# Patient Record
Sex: Male | Born: 1955 | Race: White | Hispanic: No | Smoking: Never smoker
Health system: Southern US, Community
[De-identification: ages and names within clinical notes are randomized; demographics above are authoritative.]

## PROBLEM LIST (undated history)

## (undated) DIAGNOSIS — E119 Type 2 diabetes mellitus without complications: Secondary | ICD-10-CM

## (undated) DIAGNOSIS — E785 Hyperlipidemia, unspecified: Secondary | ICD-10-CM

## (undated) DIAGNOSIS — R7303 Prediabetes: Secondary | ICD-10-CM

## (undated) DIAGNOSIS — G8929 Other chronic pain: Secondary | ICD-10-CM

## (undated) DIAGNOSIS — I4891 Unspecified atrial fibrillation: Secondary | ICD-10-CM

## (undated) DIAGNOSIS — N200 Calculus of kidney: Secondary | ICD-10-CM

## (undated) DIAGNOSIS — I10 Essential (primary) hypertension: Secondary | ICD-10-CM

## (undated) DIAGNOSIS — K219 Gastro-esophageal reflux disease without esophagitis: Secondary | ICD-10-CM

## (undated) HISTORY — PX: OTHER SURGICAL HISTORY: SHX169

## (undated) HISTORY — PX: NECK SURGERY: SHX720

## (undated) HISTORY — DX: Hyperlipidemia, unspecified: E78.5

## (undated) HISTORY — DX: Type 2 diabetes mellitus without complications: E11.9

## (undated) HISTORY — DX: Essential (primary) hypertension: I10

## (undated) HISTORY — PX: ELBOW SURGERY: SHX618

## (undated) HISTORY — PX: SHOULDER SURGERY: SHX246

---

## 1998-02-22 ENCOUNTER — Emergency Department (HOSPITAL_COMMUNITY): Admission: EM | Admit: 1998-02-22 | Discharge: 1998-02-22 | Payer: Self-pay | Admitting: Emergency Medicine

## 1998-06-01 ENCOUNTER — Encounter: Payer: Self-pay | Admitting: Emergency Medicine

## 1998-06-01 ENCOUNTER — Emergency Department (HOSPITAL_COMMUNITY): Admission: EM | Admit: 1998-06-01 | Discharge: 1998-06-01 | Payer: Self-pay | Admitting: Emergency Medicine

## 1999-11-11 ENCOUNTER — Ambulatory Visit (HOSPITAL_BASED_OUTPATIENT_CLINIC_OR_DEPARTMENT_OTHER): Admission: RE | Admit: 1999-11-11 | Discharge: 1999-11-11 | Payer: Self-pay | Admitting: Orthopedic Surgery

## 2000-04-12 ENCOUNTER — Ambulatory Visit (HOSPITAL_BASED_OUTPATIENT_CLINIC_OR_DEPARTMENT_OTHER): Admission: RE | Admit: 2000-04-12 | Discharge: 2000-04-12 | Payer: Self-pay | Admitting: Orthopedic Surgery

## 2001-03-09 ENCOUNTER — Ambulatory Visit (HOSPITAL_COMMUNITY): Admission: RE | Admit: 2001-03-09 | Discharge: 2001-03-09 | Payer: Self-pay | Admitting: Orthopedic Surgery

## 2002-09-19 ENCOUNTER — Emergency Department (HOSPITAL_COMMUNITY): Admission: EM | Admit: 2002-09-19 | Discharge: 2002-09-19 | Payer: Self-pay | Admitting: Emergency Medicine

## 2002-09-19 ENCOUNTER — Encounter: Payer: Self-pay | Admitting: Emergency Medicine

## 2003-04-04 ENCOUNTER — Emergency Department (HOSPITAL_COMMUNITY): Admission: EM | Admit: 2003-04-04 | Discharge: 2003-04-04 | Payer: Self-pay | Admitting: Internal Medicine

## 2003-08-05 ENCOUNTER — Ambulatory Visit (HOSPITAL_COMMUNITY): Admission: RE | Admit: 2003-08-05 | Discharge: 2003-08-05 | Payer: Self-pay | Admitting: Family Medicine

## 2004-04-05 ENCOUNTER — Inpatient Hospital Stay (HOSPITAL_COMMUNITY): Admission: EM | Admit: 2004-04-05 | Discharge: 2004-04-08 | Payer: Self-pay | Admitting: Emergency Medicine

## 2004-07-14 ENCOUNTER — Emergency Department (HOSPITAL_COMMUNITY): Admission: EM | Admit: 2004-07-14 | Discharge: 2004-07-14 | Payer: Self-pay | Admitting: Emergency Medicine

## 2005-01-08 ENCOUNTER — Encounter: Payer: Self-pay | Admitting: *Deleted

## 2005-01-09 ENCOUNTER — Inpatient Hospital Stay (HOSPITAL_COMMUNITY): Admission: EM | Admit: 2005-01-09 | Discharge: 2005-01-15 | Payer: Self-pay | Admitting: Emergency Medicine

## 2005-02-15 ENCOUNTER — Ambulatory Visit (HOSPITAL_COMMUNITY): Admission: RE | Admit: 2005-02-15 | Discharge: 2005-02-15 | Payer: Self-pay | Admitting: Family Medicine

## 2005-04-16 ENCOUNTER — Emergency Department (HOSPITAL_COMMUNITY): Admission: EM | Admit: 2005-04-16 | Discharge: 2005-04-16 | Payer: Self-pay | Admitting: Emergency Medicine

## 2005-04-27 ENCOUNTER — Emergency Department (HOSPITAL_COMMUNITY): Admission: EM | Admit: 2005-04-27 | Discharge: 2005-04-27 | Payer: Self-pay | Admitting: Emergency Medicine

## 2005-05-09 ENCOUNTER — Ambulatory Visit (HOSPITAL_COMMUNITY): Admission: RE | Admit: 2005-05-09 | Discharge: 2005-05-09 | Payer: Self-pay | Admitting: Family Medicine

## 2005-05-14 ENCOUNTER — Emergency Department (HOSPITAL_COMMUNITY): Admission: EM | Admit: 2005-05-14 | Discharge: 2005-05-14 | Payer: Self-pay | Admitting: *Deleted

## 2005-08-11 ENCOUNTER — Ambulatory Visit: Payer: Self-pay | Admitting: Orthopedic Surgery

## 2005-09-22 ENCOUNTER — Ambulatory Visit: Payer: Self-pay | Admitting: Orthopedic Surgery

## 2007-08-06 ENCOUNTER — Emergency Department (HOSPITAL_COMMUNITY): Admission: EM | Admit: 2007-08-06 | Discharge: 2007-08-07 | Payer: Self-pay | Admitting: Emergency Medicine

## 2009-07-20 ENCOUNTER — Ambulatory Visit (HOSPITAL_COMMUNITY): Admission: RE | Admit: 2009-07-20 | Discharge: 2009-07-20 | Payer: Self-pay | Admitting: Family Medicine

## 2010-10-22 NOTE — Op Note (Signed)
[place]  Patient:    Patrick Herrera, Patrick Herrera                        MRN: [id number] Proc. Date: 04/12/00 Adm. 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  Also, os acromiale.  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  Also, os acromiale.  Hypermobility at os acromiale and acromion interface.  Abrasive tearing superior rotator cuff, no full thickness tears.  OPERATION PERFORMED:  Left shoulder examination under anesthesia, arthroscopy with debridement of rotator cuff.  Arthroscopic acromioplasty with debridement of acromion and os acromiale interface.  Excision of distal clavicle. Bursectomy.  SURGEON:  Loreta Ave, M.D.  ASSISTANT:  Arlys John D. Petrarca, P.A.-C.  ANESTHESIA:  General.  ESTIMATED BLOOD LOSS:  Minimal.  SPECIMENS:  None.  CULTURES:  None.  COMPLICATIONS:  None.  DRESSING:  Soft compressive.  DESCRIPTION OF PROCEDURE:  Patient was brought to the operating room and placed on the operating table in supine position.  After adequate anesthesia had been obtained, left shoulder examined.  Full motion excellent stability. Placed in beach chair position on the shoulder positioner, prepped and draped in the usual sterile fashion.  Three standard arthroscopic portals, anterior, posterior, lateral.  Shoulder entered with blunt obturator, distended and inspected.  From the anterior of the shoulder rotator cuff, biceps, biceps anchor, labrum, articular cartilage, capsular ligamentous structures all were normal.  Cannula redirected subacromially.  Chronic impingement with a down sloping anterior acromion.  Marked hypermobility at the os acromion.  Marked hypermobility at the os acromion  interface with spurring along that interface. Thickened bursa and abrasive changes on the top of the cuff.  Bursa resected as well as the cuff being.  Acromioplasty to a type 1 acromion removing all spurs at the interface as well.  Distal clavicle had grade 4 changes and lateral centimeter sharply resected leaving as much of the capsular attachment in place to maintain some degree of anchoring of the os acromionale.  At completion, I had nice smooth decompression throughout.  The os actually elevates away from the cuff when the arm was abducted.  Thoroughly inspected from all angles.  Instruments and fluid were removed.  Portals, shoulder and bursa injected with Marcaine.  Portals closed with 4-0 nylon.  Sterile compressive dressing applied.  Anesthesia reversed.  Brought to recovery room.  Tolerated surgery well.  No complications. DD:  04/12/00 TD:  04/12/00 Job: [id number]

## 2010-10-22 NOTE — Discharge Summary (Signed)
Patrick Herrera, Patrick Herrera                 ACCOUNT NO.:  [id number]   MEDICAL RECORD NO.:  [id number]          PATIENT TYPE:  INP   LOCATION:  5005                         FACILITY:  [facility]   PHYSICIAN:  Stefani Dama, M.D.  DATE OF BIRTH:  07/20/1955   DATE OF ADMISSION:  01/09/2005  DATE OF DISCHARGE:  01/15/2005                                 DISCHARGE SUMMARY   ADMISSION DIAGNOSES:  1.  C5-C6 fracture.  2.  Status post syncopal episode.  3.  Cocaine, THC abuse.   DISCHARGE/FINAL DIAGNOSES:  1.  C5-C6 fracture.  2.  Status post syncopal episode.  3.  Cocaine, THC abuse.   CONDITION ON DISCHARGE:  Improved.   HOSPITAL COURSE:  [name] is a 55 year old individual who underwent  several angio-diskectomies and decompressions in the past 7 to 8 years ago.  He apparently had a syncopal episode while posing for a picture in his  daughter's wedding party. He also apparently suffered a seizure. Apparently  it was observed that he became really rigid and his eyes deviated to the  right side. The patient does have a history of taking Tylox and Xanax for  many years. On his initial workup, CT of the brain showed normal  intracranial contents, however, a C-spine demonstrated an anterior cervical  plate that had a fracture across it at the C5-C6 level and there was also a  fracture through what appeared to be a previously solid fusion at that  level. The patient was placed in a collar and was admitted to [facility]. He was placed at bedrest and subsequently an MRI of the neck was  performed, which demonstrated the area of the fracture without any evidence  of other disk disruption or other injury. The patient was taken to the  operating room ultimately on January 13, 2005 where he underwent revision  arthrodesis of the C5-C6 level. He tolerated this surgery quite well.  Swallowing has been intact. He has had some modest neck and shoulder pain.  He has been requiring Percocet  for the pain control. The issues of the  patient's substance abuse were addressed during this hospitalization and he  was seen by a Child psychotherapist, who provided the patient with contact  information for [place] and [employer]. The patient  tells me at the time of discharge that he has made arrangements for some  followup in regards to his substance abuse issues. At this time, the patient  is discharged home with strict instructions regarding his use of Percocet.  He is given a prescription for 60 of these without refills. He is also given  a prescription for Valium 5 mg #40 without refills. This should last him  approximately 2 weeks time and after that, he should be weaned away  completely from the medication. The patient will be seen in my office in 3  weeks times for further followup.   CONDITION ON DISCHARGE:  Improving.      Stefani Dama, M.D.  Electronically Signed     HJE/MEDQ  D:  01/14/2005  T:  01/15/2005  Job:  [id number]

## 2010-10-22 NOTE — Op Note (Signed)
[facility]  Patient:    Patrick Herrera, Patrick Herrera Visit Number: [id number] MRN: [id number]          Service Type: DSU Location: DAY Attending Physician:  Cain Sieve Proc. Date: 03/09/01 Admit Date:  03/09/2001                             Operative Report  PREOPERATIVE DIAGNOSES: 1. 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 2. Right shoulder acromioclavicular joint arthrosis. 3. Symptomatic os acromiale.  POSTOPERATIVE DIAGNOSES: 1. Right shoulder severe rotator cuff tendinosis with essentially    full-thickness rotator cuff tear with only a few bursal fibers remaining    intact. 2. Acromioclavicular joint hypertrophic arthropathy. 3. Symptomatic os acromiale. 4. 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  PROCEDURES: 1. Right shoulder glenohumeral joint diagnostic arthroscopy. 2. Chondroplasty of the glenoid. 3. Debridement of essentially a full-thickness rotator cuff tear creating a    full-thickness defect. 4. Subacromial decompression and bursectomy. 5. Distal clavicle resection. 6. Arthroscopic rotator cuff repair.  SURGEON:  Vania Rea. Supple, M.D.  ANESTHESIA:  General endotracheal.  ESTIMATED BLOOD LOSS:  Minimal.  HISTORY:  Patrick Herrera is a 55 year old gentleman, who has had ongoing difficulties with with right shoulder pain, weakness, limitations in motion with clinical examination showing a markedly positive impingement sign as well as weakness to manual testing.  Preoperative MRI scan confirms advanced tendinosis of the rotator cuff with possible full-thickness tear. Hypertrophic arthropathy of the [employer] joint is also noted.  Due to his ongoing pain and functional limitations, he is brought to the operating room at this time for the above-outlined procedures.  Preoperatively, he was counseled on treatment options as well as risks versus benefits thereof.  Possible complications of bleeding, infection, neurovascular  injury, persistence of pain, loss of motion, recurrence of rotator cuff tear, and possible need for additional surgery were reviewed.  He understands and accepts and agrees with our planned procedure.  DESCRIPTION OF PROCEDURE:  After undergoing routine preoperative evaluation, the patient was placed supine on the operating table and underwent smooth induction of general endotracheal anesthesia.  He received 1 g of IV cephalosporin prophylactically.  He was turned to the left lateral decubitus position on the beanbag and appropriately padded and protected with the right arm suspended at the 70/30 position with 15 pounds of traction.  The right shoulder girdle region was sterilely prepped and draped in the standard fashion.  Examination under anesthesia showed no evidence for glenohumeral instability.  A standard posterior portal was established in the glenohumeral joint, and an anterior portal was established under direct visualization.  The glenoid showed a broad area of chondromalacia approximately 2 x 2 cm at the anterior margin adjacent to the labrum.  This was debrided with a shaver.  The labrum was probed and was not felt to be detached or destabilized.  There was evidence for a broad area of marked thinning of the rotator cuff just posterior to the biceps tendon, measuring approximately 2-3 cm in width.  This area was tagged with a PDS for later evaluation on the bursal surface.  Biceps was well-attached with no obvious fraying.  The remaining inspection of the glenohumeral joint was unremarkable.  At this point, the arm was then dropped down to 30 degrees of abduction with the arthroscope introduced into the subacromial space through the posterior portal and direct lateral portals, establishing a subacromial space.  Abundant, proliferative bursal tissue was excised with  a combination of the shaver and the Mitek vapor.  The vapor was then used to remove the periosteum from the  undersurface of the anterior path of the acromion.  Subacromial decompression was then performed with the bur, creating a type 1 morphology.  The bur was then used to perform a subacromial decompression, creating a type 1 morphology.  The CA ligament was divided and resected distally.  We then established a portal directly anterior to the distal clavicle, and a distal clavicle resection was performed.  Care was taken to make sure that the entire circumference of the distal clavicle could be visualized to ensure adequate removal of bone.  We then completed the subacromial bursectomy and carefully inspected the bursal surface of the rotator cuff in the area where the tag suture had been placed and did indeed show that the rotator cuff had essentially a full-thickness defect with only very scant number of fibers remaining intact.  These were debrided away, and a full-thickness defect was then created approximately 3 cm in width.  The adjacent portion of the greater tuberosity was then freed of soft tissues using the Arthrex wand.  A bur was then introduced and used to create a bony cancellous bed at the greater tuberosity adjacent to the rotator cuff tear. Through a stab wound and off the lateral margin of the acromion, we placed two Arthrex Bio-Anchor corkscrew suture anchors.  The free limbs of the suture anchors were then passed through the free margin of the rotator cuff, utilizing the Mitek suture retriever.  A total of 8 suture limbs were passed, allowing the formation of four horizontal mattress sutures.  These were then sequentially tied, utilizing extender and sliding arthroscopic knot with multiple overhand throws and alternating posts.  This allowed excellent reduction of the free margin of the rotator cuff down to the exposed cancellous bony bed.  Inspection and probing at the end showed good stability. At this point, we then completed the subacromial bursectomy.  Fluid  and instruments were removed.  The portals were closed with staples.  Marcaine 0.5% 20 cc with epinephrine was instilled about the portals and then in the  subacromial space. Xeroform and a bulky dry dressing was then wrapped up the right shoulder.  The patient was then rolled supine, extubated, and taken to the recovery room in stable condition with a shoulder immobilizer of the right upper extremity. Attending Physician:  Cain Sieve DD:  03/09/01 TD:  03/09/01 Job: [id number]

## 2010-10-22 NOTE — H&P (Signed)
NAMEJACKOB, Patrick Herrera                 ACCOUNT NO.:  [id number]   MEDICAL RECORD NO.:  [id number]          PATIENT TYPE:  INP   LOCATION:  A304                          FACILITY:  [facility]   PHYSICIAN:  Ky Barban, M.D.DATE OF BIRTH:  20-Nov-1955   DATE OF ADMISSION:  04/05/2004  DATE OF DISCHARGE:  LH                                HISTORY & PHYSICAL   CHIEF COMPLAINT:  Recurrent left flank pain.   HISTORY:  A 55 year old gentleman presented to the emergency room last night  with severe pain in his left flank. No nausea, vomiting, fever, or chills.  CT scan shows 8-mm stone in the left ureterovesical junction causing partial  obstruction. He was having considerable pain, so it was decided to admit him  for control of pain and further management.   PAST MEDICAL HISTORY:  He has history of having problem with his spine and  his neck and had several surgeries on his neck and also left ankle, and  eventually has become disabled, cannot work, but denies any other severe  medical problem. He does have history of having kidney stones which he has  been passing. There was 1 stone several years ago, underwent stone basket.  It was in 1989.   FAMILY HISTORY:  No history of prostate cancer.   SOCIAL HISTORY:  Does not smoke or drink.   REVIEW OF SYSTEMS:  Unremarkable.   PHYSICAL EXAMINATION:  VITAL SIGNS:  Blood pressure is 130/80, temperature  is normal.  CENTRAL NERVOUS SYSTEM:  Negative.  HEENT:  Negative.  CHEST:  Symmetrical.  HEART:  Regular sinus rhythm. No murmur.  ABDOMEN:  Soft, flank. Liver, spleen, and kidneys are not palpable. 1+ left  CVA tenderness.  EXTERNAL GENITALIA:  Circumcised meatus and testicles are normal.  RECTAL:  Deferred.  EXTREMITIES:  Normal.   IMPRESSION:  Left ureteral calculus.   PLAN:  IV fluids, parenteral analgesia.    Moha  MIJ/MEDQ  D:  04/06/2004  T:  04/06/2004  Job:  [id number]

## 2010-10-22 NOTE — Op Note (Signed)
[place]  Patient:    Patrick Herrera, Patrick Herrera                        MRN: [id number] Proc. Date: 11/11/99 Adm. Date:  [id number] Disc. 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  PROCEDURE:  Left elbow exam under anesthesia, arthroscopy with partial synovectomy and chondroplasty of radial head, open exploration of lateral epicondylar region with debridement of mucinous degeneration from ECRB tendon, and drilling of epicondyle with primary repair of superficial extensors.  SURGEON:  Loreta Ave, M.D.  ASSISTANT:  Arlys John D. Petrarca, P.A.-C.  ANESTHESIA:  General.  ESTIMATED BLOOD LOSS:  Minimal.  TOURNIQUET TIME:  Forty-five minutes.  SPECIMENS:  None.  CULTURES:  None.  COMPLICATIONS:  None.  DRESSING:  Self-compressive.  DESCRIPTION OF PROCEDURE:  The patient was brought to the operating room and after adequate anesthesia had been obtained, the tourniquet was applied about the upper aspect of the left arm.  Prepped and draped in the usual sterile fashion.  An incision made over the lateral epicondyle, extending down along the extensor mass.  The skin and subcutaneous tissue divided.  Superficial extensors intact.  Open longitudinally, exposing the deep extensor ECRB tendon.  Marked mucinous degeneration over the proximal centimeter which was sharply resected back to healthy tissue.  The epicondyle debrided and treated with multiple drilling.  Lateral incision was used through the lateral portal and a medial portal was created just anterior and distal to the  medial epicondyle, avoiding the ulnar nerve.  The elbow entered, distended, and inspected.  Anterior synovitis debrided with the shaver.  Uneven frayed articular cartilage and radial head margin debrided to a stable surface with the shaver.  All structures thoroughly assessed with no other significant degenerative changes or findings.  Instruments and fluid removed.  The wounds were irrigated.  Portal closed with nylon.  The lateral incision was then closed with interrupted Vicryl, bringing the superficial extensors and then incorporating the deep extensor into the recreating longitudinal continuity of the extensor mass to the epicondyle, bringing them over to bleeding ______ and site of multiple drilling.  The skin and subcutaneous tissue closed with Vicryl, subcutaneous and subcuticular with Steri-Strips applied.  The margins of both wounds injected with Marcaine as was the elbow.  A sterile compressive dressing and splint applied.  Tourniquet deflated and removed.  The anesthesia reversed and brought to the recovery room.  Tolerated surgery well with no complications. DD:  11/11/99 TD:  11/16/99 Job: [id number]

## 2010-10-22 NOTE — H&P (Signed)
Patrick Herrera, Patrick Herrera NO.:  [id number]   MEDICAL RECORD NO.:  [id number]          PATIENT TYPE:  INP   LOCATION:  5005                         FACILITY:  [facility]   PHYSICIAN:  Hilda Lias, M.D.   DATE OF BIRTH:  1955/10/08   DATE OF ADMISSION:  01/09/2005  DATE OF DISCHARGE:                                HISTORY & PHYSICAL   Patrick Herrera is a gentleman who six years ago underwent three anterior  cervical diskectomy with fusion with a bone graft and a plate.  Last night  he suddenly developed seizure and fell down, hitting his head.  Interestingly enough, he was in a party where the daughter was taking  pictures, and she was able to show me a picture where you can see his has  really rigid, and his eyes deviated to the right side. Soon after, the  patient fell down.  The history is patient had been taking Tylox and Senna  for many years About five to six days ago he decided to stop by himself.  He  was seen at [facility].  We were called, and he was put in a brace.  At our request, he came today to be seen by [place] in the emergency room.  He is  having quite a bit of neck pain, some tenderness and tingling sensation  going to the left lower extremity.   PAST MEDICAL HISTORY:  1.  Three anterior cervical fusions.  2.  Surgery of the foot.  3.  Surgery of the right shoulder.   ALLERGIES:  PENICILLIN.   FAMILY HISTORY:  Unremarkable.   SOCIAL HISTORY:  He drinks once in a while.  He does not smoke.   PHYSICAL EXAMINATION:  HEENT:  There is abrasion of the right side of the  face.  He is in an __________brace.  He had quite a bit of tenderness of  cervical muscle.  LUNGS: Clear.  HEART:  Sounds normal.  ABDOMEN:  Normal.  EXTREMITIES:  Normal pulses.  NEUROLOGIC:  The patient oriented x 3.  Strength is normal.  Sensation is  normal, but he complains of tingling sensation which involves mostly the  thumb and index finger left  Hand.  Reflexes  symmetrical, coordination normal.  The cervical spine films  shows there is a plate from C4 down to C6 with bone graft, and there is a  crack in the plate itself with the fracture going through the bone graft on  the body of C5-6.   The CT scan showed the same finding with no evidence of bone in the canal.   CLINICAL IMPRESSION:  1.  Fracture of C5-C6 bone graft, plate, with status post fusion of 4-5 and      5-6.  2.  Seizure with longstanding ingestion of Xanax and Tylox.   RECOMMENDATIONS:  I talked to both of them at length.  The plan here is that  I was going to send him home with some medications and then to follow him  with x-ray in the office.  Nevertheless, the main problem here  is he lives  by himself.  He already has had a seizure, and I am really concerned that  patient would be driving and develop more seizure which might jeopardize  himself and the people around him.  I am going to admit him to the 3000  unit.  He is going to be admitted into the cervical surgeon who performed  the surgery.  I will be talking to him probably tomorrow.  We are going to  get the [facility] to help [place] with the diagnosis.  They are fully  aware that if there is no healing of the factor or if he does not improve  from the point of view of the finger sensation, then we are going to do a  myelogram with a CT scan.  Right now the flexion and extension shows no  evidence of any subluxation.       EB/MEDQ  D:  01/09/2005  T:  01/09/2005  Job:  [id number]

## 2010-10-22 NOTE — Op Note (Signed)
NAMEJOSHUAL, Herrera                 ACCOUNT NO.:  [id number]   MEDICAL RECORD NO.:  [id number]          PATIENT TYPE:  INP   LOCATION:  5005                         FACILITY:  [facility]   PHYSICIAN:  Stefani Dama, M.D.  DATE OF BIRTH:  January 21, 1956   DATE OF PROCEDURE:  01/13/2005  DATE OF DISCHARGE:                                 OPERATIVE REPORT   PREOPERATIVE DIAGNOSIS:  Fracture, C5-C6.   POSTOPERATIVE DIAGNOSIS:  Fracture, C5-C6.   PROCEDURE:  Anterior arthrodesis, C5-C6, with structural allograft and  Alphatec the plate fixation.   SURGEON:  Stefani Dama, M.D.   ANESTHESIA:  General endotracheal.   INDICATIONS:  [name] is a 55 year old individual who has had a  previous anterior decompression and arthrodesis with [employer] hardware and  autograft.  He had been doing well until he suffered a syncopal episode and  fell forward, striking his head and his face.  Initial x-ray demonstrated  that he had fracture of the plate, fracture across C5-C6.  The patient was  stabilized in a collar.  He was neurologically intact.  An MRI demonstrated  the evidence for the acute fracture and no other significant changes.  He  was taken to the operating room to undergo surgical stabilization of C5-C6.   PROCEDURE:  The patient was brought to the operating room supine on the  stretcher.  After the smooth induction of general endotracheal anesthesia,  was placed in five pounds of halter traction.  The neck was prepped with  DuraPrep and draped in a sterile fashion.  A transverse incision was carried  out through the previously-made incision and the dissection was then carried  down through the platysma.  The plane between the sternocleidomastoid and  the strap muscles was dissected until the prevertebral space was reached and  the lateral aspect of the plate was identified.  The soft tissues that were  scarred down in this area were carefully dissected, ultimately using a bit  of  monopolar cautery to open down to the plate.  Screws were identified  sequentially along the borders of the plate as the dissection continued and  the screws were then removed.  The plate itself was noted to have a clean  break without any evidence of significant wear at the fractured ends of the  plate.  It did appear that this break was indeed recent.  The plate was then removed after removing the sequential screws and once the  esophagus was well dissected off the border of the plate, a permanent self-  retaining Caspar retractor was placed into the wound.  With portions of the  plate removed, a small amount of dissection of overgrown bone from the  inferior margin of the plate was undertaken using a [employer]  osteotome, and this allowed for clearance of the disk space itself at the  level of the arthrodesis.  There was noted to be a crevice in the bone and  by working through this crevice, some portions of scar tissue in lateral  aspects of the crevice were identified and centrally bone fracture  fragments  itself was identified.  I suspect that the bone had been healed minimally in  this area as there was a fairly well-defined crevice of warm the inferior  border which was filled with thick, dense scar tissue.  This was drilled  down smooth so as to identify the end plates well, and the superior end  plate of body of C6 was then drilled into so as to achieve definitive  contact with the marrow cavity.  The inferior end plate of C5 was similarly  squared off in drilled into again to identify the marrow cavity.  With these  plates being secured, the endplates were cleaned smoothly with a 4 mm barrel  bit.  A 7 mm trans-graft was prepared by drilling out the center, flattening  the edges, and tamping this graft into the interspace.  This was done after  it was filled with Vitoss putty that had been prepared by mixing it with  some of the blood that had been evacuated from the  drilling into the  cancellous bone of the end plates.  This putty was packed into the center of  the trans-graft.  The trans-graft was then tamped and countersunk and then the area  surrounding trans-graft itself was packed with the Vitoss bone putty.  Traction was then removed.  A 20 mm standard-size Alphatec plate was then  affixed to the ventral aspect of the vertebral body using the previously-  drilled screws, two of which were slightly off center.  These were redrilled  and locking 4 x 14 mm screws were placed into the plate.  These were then  secured and final localizing radiograph identified good position of the  surgical construct.  The wound was then copiously irrigated with antibiotic  irrigating solution.  Hemostasis was checked in the soft tissues.  Once this  was verified, 3-0 Vicryl was used to close the platysma and 3-0 Vicryl in  the subcuticular tissues.  Dermabond was placed on the skin.  The patient  tolerated procedure well, was returned to recovery room in stable condition.      Stefani Dama, M.D.  Electronically Signed     HJE/MEDQ  D:  01/13/2005  T:  01/14/2005  Job:  [id number]

## 2010-10-22 NOTE — Op Note (Signed)
Patrick Herrera, Patrick Herrera                 ACCOUNT NO.:  [id number]   MEDICAL RECORD NO.:  [id number]          PATIENT TYPE:  INP   LOCATION:  A304                          FACILITY:  [facility]   PHYSICIAN:  Ky Barban, M.D.DATE OF BIRTH:  04/16/1956   DATE OF PROCEDURE:  DATE OF DISCHARGE:                                 OPERATIVE REPORT   PREOPERATIVE DIAGNOSIS:  Left ureteral calculus.   POSTOPERATIVE DIAGNOSIS:  Left ureteral calculus.   PROCEDURES:  Cystoscopy, left retrograde pyelogram, ureteroscopic stone  basket, holmium laser lithotripsy, insertion of double J stent with string  size 5 [place], 24 cm.   ANESTHESIA:  General.   PROCEDURE:  The patient is given general endotracheal anesthesia, placed in  lithotomy position after the usual prep and drape.  A #25 cystoscope was  introduced into the bladder.  It is inspected.  The left ureteral orifice  and the intramural ureter appear to be quite edematous and distended, but no  stone is visible through the orifice.  At this point a wedge catheter is  passed and Hypaque is injected in the left ureter.  I can see there is a  filling defect in the left ureterovesical junction, and the ureter above  that is markedly dilated.  At this point the wedge catheter is removed and a  guidewire is passed up into the renal pelvis.  The cystoscope is removed,  leaving the guidewire in place.  Now alongside the guidewire I introduced a  short rigid ureteroscope.  Before introducing the ureteroscope, I had  dilated the intramural ureter with a balloon dilator.  Now the short rigid  ureteroscope is passed alongside the ureteroscope.  I went to the level of  the stone and under direct vision, the stone is treated with holmium laser.  It is pulverized completely.  Then using a stone basket, these pieces were  carefully basketed.  At the end, the ureter is inspected and it looks  intact, but as I mentioned, there is considerable redness, edema,and  exfoliation of the mucosa at the ureterovesical junction, where that is  where the stone was impacted.  The rest of the distal ureter looks fine.  At  this point the ureteroscope is removed and the cystoscope was reintroduced  over the guidewire and under direct vision with fluoroscopic control, a  double J stent size 5 [place], 24 cm, is passed up into the renal pelvis.  A  nice loop is obtained in the renal pelvis and the bladder.  The cystoscope  is carefully removed, and the string was taped to his penis.  All the  instruments were removed.  The patient left the operating room in  satisfactory condition.     Moha   MIJ/MEDQ  D:  04/07/2004  T:  04/07/2004  Job:  [id number]

## 2010-10-22 NOTE — Discharge Summary (Signed)
NAMECOLIN, ELLERS                 ACCOUNT NO.:  [id number]   MEDICAL RECORD NO.:  [id number]          PATIENT TYPE:  INP   LOCATION:  A304                          FACILITY:  [facility]   PHYSICIAN:  Ky Barban, M.D.DATE OF BIRTH:  08-May-1956   DATE OF ADMISSION:  04/05/2004  DATE OF DISCHARGE:  11/03/2005LH                                 DISCHARGE SUMMARY   This 55 year old gentleman was admitted through the emergency room with left  renal colic. CT showed he had 8-mm stone in the left ureterovesical junction  causing obstruction. The patient was admitted.   HOSPITAL COURSE:  Routine admission workup, CBC, urinalysis, met7 is normal.  He was started on IV fluid and parenteral analgesia. Currently, due to  complaint of pain, requested that he wanted me to go ahead and remove the  stone. After discussing various treatment options, I elected to go ahead and  do a stone basket. He was taken to the operating room on November 2.  Cystoscopy, left retrograde pyelogram, ureteroscopic stone basket, Holmium  laser lithotripsy was done, and a double J stent was left in with string  attached. Postoperatively, he did fine, but during the night, apparently the  stent pulled out, and he started to have incontinence, so I removed the  stent and was sent home and has no problems.   FINAL DIAGNOSIS:  Left ureteral calculus.   DISCHARGE CONDITION:  Improved.   DISCHARGE MEDICATIONS:  Percocet 1 q.6h. p.r.n. #30.   FOLLOW UP:  Report to the office in 2 weeks.      MIJ/MEDQ  D:  05/27/2004  T:  05/28/2004  Job:  [id number]

## 2011-02-28 LAB — POCT I-STAT, CHEM 8
BUN: 19
Calcium, Ion: 1 — ABNORMAL LOW
Chloride: 104
Creatinine, Ser: 1.1
Glucose, Bld: 113 — ABNORMAL HIGH
HCT: 51
Hemoglobin: 17.3 — ABNORMAL HIGH
Potassium: 3.4 — ABNORMAL LOW
Sodium: 138
TCO2: 23

## 2011-02-28 LAB — POCT CARDIAC MARKERS
CKMB, poc: 1.4
CKMB, poc: 1.6
Myoglobin, poc: 61.7
Myoglobin, poc: 87.1
Operator id: 284061
Operator id: 284061
Troponin i, poc: <0.05
Troponin i, poc: <0.05

## 2012-05-24 ENCOUNTER — Emergency Department (HOSPITAL_COMMUNITY): Payer: Medicare Other

## 2012-05-24 ENCOUNTER — Encounter (HOSPITAL_COMMUNITY): Payer: Self-pay | Admitting: *Deleted

## 2012-05-24 ENCOUNTER — Emergency Department (HOSPITAL_COMMUNITY)
Admission: EM | Admit: 2012-05-24 | Discharge: 2012-05-24 | Disposition: A | Discharge status: Home / Self Care | Payer: Medicare Other | Attending: Emergency Medicine | Admitting: Emergency Medicine

## 2012-05-24 DIAGNOSIS — R109 Unspecified abdominal pain: Secondary | ICD-10-CM | POA: Insufficient documentation

## 2012-05-24 DIAGNOSIS — R3989 Other symptoms and signs involving the genitourinary system: Secondary | ICD-10-CM | POA: Insufficient documentation

## 2012-05-24 DIAGNOSIS — G8929 Other chronic pain: Secondary | ICD-10-CM | POA: Insufficient documentation

## 2012-05-24 DIAGNOSIS — M549 Dorsalgia, unspecified: Secondary | ICD-10-CM | POA: Insufficient documentation

## 2012-05-24 DIAGNOSIS — Z87891 Personal history of nicotine dependence: Secondary | ICD-10-CM | POA: Insufficient documentation

## 2012-05-24 DIAGNOSIS — N390 Urinary tract infection, site not specified: Secondary | ICD-10-CM | POA: Insufficient documentation

## 2012-05-24 DIAGNOSIS — Z87442 Personal history of urinary calculi: Secondary | ICD-10-CM | POA: Insufficient documentation

## 2012-05-24 DIAGNOSIS — Z79899 Other long term (current) drug therapy: Secondary | ICD-10-CM | POA: Insufficient documentation

## 2012-05-24 DIAGNOSIS — R300 Dysuria: Secondary | ICD-10-CM | POA: Insufficient documentation

## 2012-05-24 DIAGNOSIS — Z880 Allergy status to penicillin: Secondary | ICD-10-CM | POA: Insufficient documentation

## 2012-05-24 DIAGNOSIS — Z9889 Other specified postprocedural states: Secondary | ICD-10-CM | POA: Insufficient documentation

## 2012-05-24 HISTORY — DX: Other chronic pain: G89.29

## 2012-05-24 HISTORY — DX: Calculus of kidney: N20.0

## 2012-05-24 LAB — CBC WITH DIFFERENTIAL/PLATELET
Eosinophils Absolute: 0.2 10*3/uL (ref 0.0–0.7)
Eosinophils Relative: 3 % (ref 0–5)
HCT: 52.1 % — ABNORMAL HIGH (ref 39.0–52.0)
Hemoglobin: 18.6 g/dL — ABNORMAL HIGH (ref 13.0–17.0)
Lymphs Abs: 2.5 10*3/uL (ref 0.7–4.0)
MCH: 30.5 pg (ref 26.0–34.0)
MCV: 85.6 fL (ref 78.0–100.0)
Monocytes Absolute: 0.5 10*3/uL (ref 0.1–1.0)
Monocytes Relative: 6 % (ref 3–12)
RBC: 6.09 MIL/uL — ABNORMAL HIGH (ref 4.22–5.81)

## 2012-05-24 LAB — URINALYSIS, ROUTINE W REFLEX MICROSCOPIC
Bilirubin Urine: NEGATIVE
Hgb urine dipstick: NEGATIVE
Specific Gravity, Urine: >1.03 — ABNORMAL HIGH (ref 1.005–1.030)
Urobilinogen, UA: 0.2 mg/dL (ref 0.0–1.0)

## 2012-05-24 LAB — URINE MICROSCOPIC-ADD ON

## 2012-05-24 LAB — BASIC METABOLIC PANEL
BUN: 16 mg/dL (ref 6–23)
Creatinine, Ser: 1.14 mg/dL (ref 0.50–1.35)
GFR calc non Af Amer: 71 mL/min — ABNORMAL LOW (ref >90)
Glucose, Bld: 143 mg/dL — ABNORMAL HIGH (ref 70–99)
Potassium: 4.2 mEq/L (ref 3.5–5.1)

## 2012-05-24 MED ORDER — ONDANSETRON HCL 4 MG/2ML IJ SOLN
4.0000 mg | Freq: Once | INTRAMUSCULAR | Status: AC
  Administered 2012-05-24: 4 mg via INTRAVENOUS
  Filled 2012-05-24: qty 2

## 2012-05-24 MED ORDER — OXYCODONE-ACETAMINOPHEN 5-325 MG PO TABS: 1.0000 | ORAL_TABLET | ORAL | Status: AC | PRN

## 2012-05-24 MED ORDER — PROMETHAZINE HCL 25 MG/ML IJ SOLN
12.5000 mg | Freq: Once | INTRAMUSCULAR | Status: AC
  Administered 2012-05-24: 12.5 mg via INTRAVENOUS
  Filled 2012-05-24: qty 1

## 2012-05-24 MED ORDER — CIPROFLOXACIN HCL 500 MG PO TABS: 500.0000 mg | ORAL_TABLET | Freq: Two times a day (BID) | ORAL | Status: DC

## 2012-05-24 MED ORDER — HYDROMORPHONE HCL PF 1 MG/ML IJ SOLN
1.0000 mg | Freq: Once | INTRAMUSCULAR | Status: AC
  Administered 2012-05-24: 1 mg via INTRAVENOUS
  Filled 2012-05-24: qty 1

## 2012-05-24 MED ORDER — ONDANSETRON HCL 8 MG PO TABS: 8.0000 mg | ORAL_TABLET | Freq: Three times a day (TID) | ORAL | Status: DC | PRN

## 2012-05-24 NOTE — ED Provider Notes (Signed)
History     CSN: [id number]  Arrival date & time 05/24/12  1405   First MD Initiated Contact with Patient 05/24/12 1450      Chief Complaint  Patient presents with  . Abdominal Pain    (Consider location/radiation/quality/duration/timing/severity/associated sxs/prior treatment) HPI Comments: Patient complains of a gradual onset of left flank pain that began one to 2 weeks ago. States pain at that time was waxing and waning. He states that yesterday the pain began to radiate to his lower abdomen and has been worse since last evening. He complains of difficulty urinating this morning. Patient reports a history of kidney stones in the past but states this pain is similar to previous kidney stones. He denies nausea or vomiting, fever, testicular pain, or discharge from his penis.  Patient is a 56 y.o. male presenting with abdominal pain. The history is provided by the patient.  Abdominal Pain The primary symptoms of the illness include abdominal pain and dysuria. The primary symptoms of the illness do not include fever, shortness of breath, nausea, vomiting, diarrhea or hematemesis. The current episode started more than 2 days ago. The onset of the illness was gradual. The problem has been gradually worsening.  The abdominal pain began yesterday. The pain came on gradually. The abdominal pain has been unchanged since its onset. The abdominal pain is located in the suprapubic region. The abdominal pain radiates to the left flank. The abdominal pain is relieved by nothing. Exacerbated by: Nothing.  The dysuria began today. The discomfort is mild. The dysuria is not associated with discharge, hematuria, frequency, urgency, penile pain or scrotal pain.  Associated with: History of kidney stones. The patient has not had a change in bowel habit. Additional symptoms associated with the illness include back pain. Symptoms associated with the illness do not include chills, diaphoresis, heartburn,  constipation, urgency, hematuria or frequency. Significant associated medical issues do not include diabetes or cardiac disease.    Past Medical History  Diagnosis Date  . Kidney stones   . Chronic pain     Past Surgical History  Procedure Date  . Neck surgery   . Elbow surgery   . Bil knee surgery   . Shoulder surgery     History reviewed. No pertinent family history.  History  Substance Use Topics  . Smoking status: Former Smoker    Types: Cigarettes  . Smokeless tobacco: Not on file  . Alcohol Use: Yes      Review of Systems  Constitutional: Negative for fever, chills, diaphoresis, activity change and appetite change.  HENT: Negative for neck pain.   Respiratory: Negative for chest tightness and shortness of breath.   Cardiovascular: Negative for chest pain and palpitations.  Gastrointestinal: Positive for abdominal pain. Negative for heartburn, nausea, vomiting, diarrhea, constipation, abdominal distention and hematemesis.  Genitourinary: Positive for dysuria, flank pain and difficulty urinating. Negative for urgency, frequency, hematuria, discharge, scrotal swelling, penile pain and testicular pain.  Musculoskeletal: Positive for back pain.  Skin: Negative for color change.  Neurological: Negative for headaches.  All other systems reviewed and are negative.    Allergies  Penicillins  Home Medications   Current Outpatient Rx  Name  Route  Sig  Dispense  Refill  . ALPRAZOLAM 1 MG PO TABS   Oral   Take 1 mg by mouth 4 (four) times daily as needed. anxiety         . IBUPROFEN 200 MG PO TABS   Oral   Take 400 mg  by mouth every 6 (six) hours as needed. pain         . OXYCODONE-ACETAMINOPHEN 5-500 MG PO CAPS   Oral   Take 1 capsule by mouth every 6 (six) hours as needed. pain           BP 176/108  Pulse 118  Temp 98 F (36.7 C) (Oral)  Resp 20  Ht 5\' 8"  (1.727 m)  Wt 222 lb (100.699 kg)  BMI 33.76 kg/m2  SpO2 99%  Physical Exam  Nursing  note and vitals reviewed. Constitutional: He is oriented to person, place, and time. He appears well-developed and well-nourished. No distress.  HENT:  Head: Normocephalic and atraumatic.  Neck: Normal range of motion. Neck supple.  Cardiovascular: Normal rate, regular rhythm, normal heart sounds and intact distal pulses.   No murmur heard. Pulmonary/Chest: Effort normal and breath sounds normal. No respiratory distress. He has no wheezes. He exhibits no tenderness.  Abdominal: Soft. He exhibits no distension and no mass. There is no hepatosplenomegaly. There is tenderness in the suprapubic area. There is no rigidity, no rebound, no guarding, no CVA tenderness and no tenderness at McBurney's point.    Musculoskeletal: Normal range of motion. He exhibits no edema.  Lymphadenopathy:    He has no cervical adenopathy.  Neurological: He is alert and oriented to person, place, and time. He exhibits normal muscle tone. Coordination normal.  Skin: Skin is warm and dry.    ED Course  Procedures (including critical care time)    Results for orders placed during the hospital encounter of 05/24/12  CBC WITH DIFFERENTIAL      Component Value Range   WBC 9.3  4.0 - 10.5 K/uL   RBC 6.09 (*) 4.22 - 5.81 MIL/uL   Hemoglobin 18.6 (*) 13.0 - 17.0 g/dL   HCT 16.1 (*) 09.6 - 04.5 %   MCV 85.6  78.0 - 100.0 fL   MCH 30.5  26.0 - 34.0 pg   MCHC 35.7  30.0 - 36.0 g/dL   RDW 40.9  81.1 - 91.4 %   Platelets 219  150 - 400 K/uL   Neutrophils Relative 64  43 - 77 %   Neutro Abs 6.0  1.7 - 7.7 K/uL   Lymphocytes Relative 27  12 - 46 %   Lymphs Abs 2.5  0.7 - 4.0 K/uL   Monocytes Relative 6  3 - 12 %   Monocytes Absolute 0.5  0.1 - 1.0 K/uL   Eosinophils Relative 3  0 - 5 %   Eosinophils Absolute 0.2  0.0 - 0.7 K/uL   Basophils Relative 0  0 - 1 %   Basophils Absolute 0.0  0.0 - 0.1 K/uL  BASIC METABOLIC PANEL      Component Value Range   Sodium 137  135 - 145 mEq/L   Potassium 4.2  3.5 - 5.1 mEq/L     Chloride 100  96 - 112 mEq/L   CO2 25  19 - 32 mEq/L   Glucose, Bld 143 (*) 70 - 99 mg/dL   BUN 16  6 - 23 mg/dL   Creatinine, Ser 7.82  0.50 - 1.35 mg/dL   Calcium 95.6  8.4 - 21.3 mg/dL   GFR calc non Af Amer 71 (*) >90 mL/min   GFR calc Af Amer 82 (*) >90 mL/min  URINALYSIS, ROUTINE W REFLEX MICROSCOPIC      Component Value Range   Color, Urine YELLOW  YELLOW   APPearance CLEAR  CLEAR  Specific Gravity, Urine >1.030 (*) 1.005 - 1.030   pH 6.0  5.0 - 8.0   Glucose, UA NEGATIVE  NEGATIVE mg/dL   Hgb urine dipstick NEGATIVE  NEGATIVE   Bilirubin Urine NEGATIVE  NEGATIVE   Ketones, ur TRACE (*) NEGATIVE mg/dL   Protein, ur TRACE (*) NEGATIVE mg/dL   Urobilinogen, UA 0.2  0.0 - 1.0 mg/dL   Nitrite NEGATIVE  NEGATIVE   Leukocytes, UA NEGATIVE  NEGATIVE  GLUCOSE, CAPILLARY      Component Value Range   Glucose-Capillary 114 (*) 70 - 99 mg/dL  URINE MICROSCOPIC-ADD ON      Component Value Range   WBC, UA 0-2  <3 WBC/hpf   Bacteria, UA FEW (*) RARE     Ct Abdomen Pelvis Wo Contrast  05/24/2012  *RADIOLOGY REPORT*  Clinical Data: Left flank pain.  CT ABDOMEN AND PELVIS WITHOUT CONTRAST  Technique:  Multidetector CT imaging of the abdomen and pelvis was performed following the standard protocol without intravenous contrast.  Comparison: CT scan 05/24/2005.  Findings: The lung bases are clear.  No pleural effusion or pulmonary nodule.  The heart is normal in size.  No pericardial effusion.  The distal esophagus is unremarkable.  There is diffuse fatty infiltration of the liver but no focal hepatic lesions or intrahepatic biliary dilatation.  The gallbladder is normal.  No common bile duct dilatation.  The pancreas is normal.  The spleen is normal in size.  No focal lesions.  The adrenal glands and kidneys are normal.  No renal or obstructing ureteral calculi.  No bladder calculi.  The stomach, duodenum, small bowel and colon are grossly normal without oral contrast.  The appendix is  normal.  The aorta is normal in caliber.  Mild atherosclerotic calcifications.  No mesenteric or retroperitoneal masses or adenopathy.  Small scattered lymph nodes are noted.  The bladder, prostate gland and seminal vesicles are unremarkable. No pelvic mass, adenopathy or free pelvic fluid collections. Moderate diverticulosis of the sigmoid colon without findings for acute diverticulitis.  No inguinal mass or hernia.  The bony structures are unremarkable. L5 Schmorl's node is noted.  IMPRESSION: Unremarkable noncontrast CT examination of the abdomen/pelvis.   Original Report Authenticated By: Rudie Meyer, M.D.      Urine culture pending  MDM   Previous charts reviewed, today's lab results reviewed, also had elevated hgb 4 years ago.     4:50 pm  Patient is feeling better after IVF's, IV dilaudid, zofran and phenergan.  Waiting for UA.  Patient tolerating fluids w/o difficulty, feels better and appears stable for d/c,  abd remains soft, no peritoneal signs .  Will treat for UTI.  Pt agrees to drink plenty of fluids, and f/u with Dr. Jerre Simon or his PMD.        Tarri Guilfoil L. [place], [place] 05/25/12 2353

## 2012-05-24 NOTE — ED Notes (Signed)
"  I think I have a kidney stone.  "  Pain now  Low mid abd.  Nausea. No vomiting.

## 2012-05-24 NOTE — ED Notes (Signed)
Pt c/o right side flank pain and lower abdominal pain. Pt states he has hx of kidney stones and this "feels the same". Pt states he "can't urinate because of the pain". Pt denies N/V and chest pain.

## 2012-05-26 LAB — URINE CULTURE

## 2012-05-29 NOTE — ED Provider Notes (Signed)
Medical screening examination/treatment/procedure(s) were conducted as a shared visit with non-physician practitioner(s) and myself.  I personally evaluated the patient during the encounter.  No acute abdomen.CT scan of abdomen pelvis negative for acute pathology  Donnetta Hutching, MD 05/29/12 1610

## 2014-05-19 ENCOUNTER — Emergency Department (HOSPITAL_COMMUNITY)
Admission: EM | Admit: 2014-05-19 | Discharge: 2014-05-20 | Disposition: A | Discharge status: Home / Self Care | Payer: Medicare HMO | Attending: Emergency Medicine | Admitting: Emergency Medicine

## 2014-05-19 ENCOUNTER — Encounter (HOSPITAL_COMMUNITY): Payer: Self-pay | Admitting: *Deleted

## 2014-05-19 DIAGNOSIS — M542 Cervicalgia: Secondary | ICD-10-CM | POA: Diagnosis present

## 2014-05-19 DIAGNOSIS — M62838 Other muscle spasm: Secondary | ICD-10-CM | POA: Diagnosis not present

## 2014-05-19 DIAGNOSIS — Z880 Allergy status to penicillin: Secondary | ICD-10-CM | POA: Insufficient documentation

## 2014-05-19 DIAGNOSIS — Z87442 Personal history of urinary calculi: Secondary | ICD-10-CM | POA: Diagnosis not present

## 2014-05-19 DIAGNOSIS — Z792 Long term (current) use of antibiotics: Secondary | ICD-10-CM | POA: Diagnosis not present

## 2014-05-19 DIAGNOSIS — G8929 Other chronic pain: Secondary | ICD-10-CM | POA: Diagnosis not present

## 2014-05-19 DIAGNOSIS — Z79899 Other long term (current) drug therapy: Secondary | ICD-10-CM | POA: Insufficient documentation

## 2014-05-19 DIAGNOSIS — Z87891 Personal history of nicotine dependence: Secondary | ICD-10-CM | POA: Insufficient documentation

## 2014-05-19 NOTE — ED Notes (Signed)
Pain rt side of neck and into rt shoulder, Out of pain meds.   Hx of chronic neck pain with mult surgeries

## 2014-05-20 MED ORDER — METHOCARBAMOL 500 MG PO TABS: 500.0000 mg | ORAL_TABLET | Freq: Three times a day (TID) | ORAL | Status: DC

## 2014-05-20 MED ORDER — HYDROMORPHONE HCL 1 MG/ML IJ SOLN: 1.0000 mg | Freq: Once | INTRAMUSCULAR | Status: DC

## 2014-05-20 MED ORDER — OXYCODONE-ACETAMINOPHEN 5-500 MG PO CAPS: 1.0000 | ORAL_CAPSULE | Freq: Four times a day (QID) | ORAL | Status: DC | PRN

## 2014-05-20 MED ORDER — METHOCARBAMOL 500 MG PO TABS
1000.0000 mg | ORAL_TABLET | Freq: Once | ORAL | Status: AC
  Administered 2014-05-20: 1000 mg via ORAL
  Filled 2014-05-20: qty 2

## 2014-05-20 MED ORDER — HYDROMORPHONE HCL 1 MG/ML IJ SOLN
1.0000 mg | Freq: Once | INTRAMUSCULAR | Status: AC
Start: 2014-05-20 — End: 2014-05-20
  Administered 2014-05-20: 1 mg via INTRAMUSCULAR
  Filled 2014-05-20: qty 1

## 2014-05-20 MED ORDER — ONDANSETRON HCL 4 MG PO TABS
4.0000 mg | ORAL_TABLET | Freq: Once | ORAL | Status: AC
  Administered 2014-05-20: 4 mg via ORAL
  Filled 2014-05-20: qty 1

## 2014-05-20 NOTE — ED Provider Notes (Signed)
CSN: [id number]     Arrival date & time 05/19/14  2043 History   First MD Initiated Contact with Patient 05/20/14 0046     Chief Complaint  Patient presents with  . Neck Pain     (Consider location/radiation/quality/duration/timing/severity/associated sxs/prior Treatment) HPI Comments: Pt reports 4 neck surgeries. Patient states he occasionally has breakthrough pain. He says that his pain doctors and neurosurgeons have told him that he can take an extra pill if need be. He is now out of his medications, and having pain involving the right side of his neck and shoulder.  Patient is a 58 y.o. male presenting with neck pain. The history is provided by the patient.  Neck Pain Pain location:  R side Quality:  Aching, cramping and shooting Pain radiates to:  R shoulder Pain severity:  Severe Pain is:  Same all the time Duration:  5 days Timing:  Intermittent Progression:  Worsening Chronicity:  Chronic Relieved by:  Nothing Exacerbated by: movement. Ineffective treatments:  None tried Associated symptoms: no bladder incontinence, no bowel incontinence, no chest pain and no photophobia     Past Medical History  Diagnosis Date  . Kidney stones   . Chronic pain    Past Surgical History  Procedure Laterality Date  . Neck surgery    . Elbow surgery    . Bil knee surgery    . Shoulder surgery     History reviewed. No pertinent family history. History  Substance Use Topics  . Smoking status: Former Smoker    Types: Cigarettes  . Smokeless tobacco: Not on file  . Alcohol Use: Yes    Review of Systems  Constitutional: Negative for activity change.       All ROS Neg except as noted in HPI  Eyes: Negative for photophobia and discharge.  Respiratory: Negative for cough, shortness of breath and wheezing.   Cardiovascular: Negative for chest pain and palpitations.  Gastrointestinal: Negative for abdominal pain, blood in stool and bowel incontinence.  Genitourinary: Negative for  bladder incontinence, dysuria, frequency and hematuria.  Musculoskeletal: Positive for arthralgias and neck pain. Negative for back pain.  Skin: Negative.   Neurological: Negative for dizziness, seizures and speech difficulty.  Psychiatric/Behavioral: Negative for hallucinations and confusion.      Allergies  Penicillins  Home Medications   Prior to Admission medications   Medication Sig Start Date End Date Taking? Authorizing Provider  ALPRAZolam Prudy Feeler(XANAX) 1 MG tablet Take 1 mg by mouth 4 (four) times daily as needed. anxiety    Historical Provider, MD  ciprofloxacin (CIPRO) 500 MG tablet Take 1 tablet (500 mg total) by mouth 2 (two) times daily. For 7 days 05/24/12   Tammy L. Triplett, PA-C  ibuprofen (ADVIL) 200 MG tablet Take 400 mg by mouth every 6 (six) hours as needed. pain    Historical Provider, MD  ondansetron (ZOFRAN) 8 MG tablet Take 1 tablet (8 mg total) by mouth every 8 (eight) hours as needed for nausea. 05/24/12   Tammy L. Triplett, PA-C  oxyCODONE-acetaminophen (TYLOX) 5-500 MG per capsule Take 1 capsule by mouth every 6 (six) hours as needed. pain    Historical Provider, MD   BP 171/128 mmHg  Pulse 85  Temp(Src) 98.1 F (36.7 C) (Oral)  Resp 18  Ht 5' 8.5" (1.74 m)  Wt 238 lb (107.956 kg)  BMI 35.66 kg/m2  SpO2 100% Physical Exam  Constitutional: He is oriented to person, place, and time. He appears well-developed and well-nourished.  Non-toxic appearance.  HENT:  Head: Normocephalic.  Right Ear: Tympanic membrane and external ear normal.  Left Ear: Tympanic membrane and external ear normal.  Eyes: EOM and lids are normal. Pupils are equal, round, and reactive to light.  Neck: Normal range of motion. Neck supple. Carotid bruit is not present.  Cardiovascular: Normal rate, regular rhythm, normal heart sounds, intact distal pulses and normal pulses.   Pulmonary/Chest: Breath sounds normal. No respiratory distress.  Abdominal: Soft. Bowel sounds are normal. There  is no tenderness. There is no guarding.  Musculoskeletal:       Cervical back: He exhibits decreased range of motion, tenderness, pain and spasm.       Back:  There is decreased range of motion of the neck and right shoulder due to pain. There is spasm of the upper trapezius extending into the right neck. No hot areas appreciated.  Radial pulses are 2+ bilaterally. Capillary refill is less than 2 seconds.  Lymphadenopathy:       Head (right side): No submandibular adenopathy present.       Head (left side): No submandibular adenopathy present.    He has no cervical adenopathy.  Neurological: He is alert and oriented to person, place, and time. He has normal strength. No cranial nerve deficit or sensory deficit.  Grip is symmetrical. Motor strength of the upper extremities is symmetrical.  Gait is intact.  Skin: Skin is warm and dry.  Psychiatric: He has a normal mood and affect. His speech is normal.  Nursing note and vitals reviewed.   ED Course  Procedures (including critical care time) Labs Review Labs Reviewed - No data to display  Imaging Review No results found.   EKG Interpretation None      MDM Patient has chronic pain involving the neck and shoulder. He has had multiple surgeries in this area. He is currently out of his pain medication. He is scheduled to see his primary physician on tomorrow. He is also scheduled for an MRI in January by one of the orthopedic specialist. No gross neurologic deficit appreciated on examination at this time. No hot areas appreciated. With the exception of the blood pressure being elevated, the vital signs are well within normal limits.  Prescription for Robaxin 500 mg 3 times daily for 12 tablets given, Tylox 5 mg, one every 6 hours as needed for pain also given to the patient. Patient treated in the emergency department with Dilaudid injection.    Final diagnoses:  None    **I have reviewed nursing notes, vital signs, and all  appropriate lab and imaging results for this patient.Kathie Dike*    Summit Arroyave M Harli Engelken, PA-C 05/20/14 [id number]  Ward GivensIva L Knapp, MD 05/20/14 [phone]

## 2014-05-20 NOTE — ED Notes (Signed)
Patient presents tonight with chronic neck pain.  States out of pain medication.

## 2014-05-20 NOTE — Discharge Instructions (Signed)
Please see Dr Janna ArchonDiego tomorrow for assistance with your pain management. Oxycodone and robaxin may cause drowsiness, use with caution.

## 2016-12-02 ENCOUNTER — Emergency Department (HOSPITAL_COMMUNITY)
Admission: EM | Admit: 2016-12-02 | Discharge: 2016-12-02 | Disposition: A | Discharge status: Home / Self Care | Payer: Commercial Managed Care - HMO | Attending: Emergency Medicine | Admitting: Emergency Medicine

## 2016-12-02 ENCOUNTER — Emergency Department (HOSPITAL_COMMUNITY): Payer: Commercial Managed Care - HMO

## 2016-12-02 DIAGNOSIS — R300 Dysuria: Secondary | ICD-10-CM | POA: Diagnosis not present

## 2016-12-02 DIAGNOSIS — Z87891 Personal history of nicotine dependence: Secondary | ICD-10-CM | POA: Diagnosis not present

## 2016-12-02 DIAGNOSIS — K5732 Diverticulitis of large intestine without perforation or abscess without bleeding: Secondary | ICD-10-CM | POA: Insufficient documentation

## 2016-12-02 DIAGNOSIS — F1290 Cannabis use, unspecified, uncomplicated: Secondary | ICD-10-CM | POA: Diagnosis not present

## 2016-12-02 DIAGNOSIS — Z79899 Other long term (current) drug therapy: Secondary | ICD-10-CM | POA: Diagnosis not present

## 2016-12-02 DIAGNOSIS — R319 Hematuria, unspecified: Secondary | ICD-10-CM | POA: Insufficient documentation

## 2016-12-02 DIAGNOSIS — R109 Unspecified abdominal pain: Secondary | ICD-10-CM | POA: Diagnosis present

## 2016-12-02 LAB — CBC WITH DIFFERENTIAL/PLATELET
Basophils Absolute: 0 10*3/uL (ref 0.0–0.1)
Basophils Relative: 0 %
Eosinophils Absolute: 0.1 10*3/uL (ref 0.0–0.7)
Eosinophils Relative: 2 %
HEMATOCRIT: 43.6 % (ref 39.0–52.0)
HEMOGLOBIN: 15.4 g/dL (ref 13.0–17.0)
LYMPHS ABS: 2.6 10*3/uL (ref 0.7–4.0)
LYMPHS PCT: 34 %
MCH: 29.7 pg (ref 26.0–34.0)
MCHC: 35.3 g/dL (ref 30.0–36.0)
MCV: 84.2 fL (ref 78.0–100.0)
Monocytes Absolute: 0.5 10*3/uL (ref 0.1–1.0)
Monocytes Relative: 7 %
NEUTROS ABS: 4.5 10*3/uL (ref 1.7–7.7)
NEUTROS PCT: 57 %
Platelets: 190 10*3/uL (ref 150–400)
RBC: 5.18 MIL/uL (ref 4.22–5.81)
RDW: 12.6 % (ref 11.5–15.5)
WBC: 7.8 10*3/uL (ref 4.0–10.5)

## 2016-12-02 LAB — URINALYSIS, ROUTINE W REFLEX MICROSCOPIC
Bacteria, UA: NONE SEEN
Bilirubin Urine: NEGATIVE
Glucose, UA: NEGATIVE mg/dL
Ketones, ur: NEGATIVE mg/dL
Leukocytes, UA: NEGATIVE
Nitrite: NEGATIVE
PH: 5 (ref 5.0–8.0)
Protein, ur: 30 mg/dL — AB
SPECIFIC GRAVITY, URINE: 1.023 (ref 1.005–1.030)

## 2016-12-02 LAB — BASIC METABOLIC PANEL
Anion gap: 9 (ref 5–15)
BUN: 14 mg/dL (ref 6–20)
CHLORIDE: 104 mmol/L (ref 101–111)
CO2: 26 mmol/L (ref 22–32)
CREATININE: 0.93 mg/dL (ref 0.61–1.24)
Calcium: 9.5 mg/dL (ref 8.9–10.3)
GFR calc Af Amer: >60 mL/min (ref >60)
GFR calc non Af Amer: >60 mL/min (ref >60)
Glucose, Bld: 115 mg/dL — ABNORMAL HIGH (ref 65–99)
POTASSIUM: 3.4 mmol/L — AB (ref 3.5–5.1)
Sodium: 139 mmol/L (ref 135–145)

## 2016-12-02 MED ORDER — SODIUM CHLORIDE 0.9 % IV BOLUS (SEPSIS)
1000.0000 mL | Freq: Once | INTRAVENOUS | Status: AC
  Administered 2016-12-02: 1000 mL via INTRAVENOUS

## 2016-12-02 MED ORDER — HYDROMORPHONE HCL 1 MG/ML IJ SOLN
1.0000 mg | Freq: Once | INTRAMUSCULAR | Status: AC
  Administered 2016-12-02: 1 mg via INTRAVENOUS
  Filled 2016-12-02: qty 1

## 2016-12-02 MED ORDER — ONDANSETRON HCL 4 MG/2ML IJ SOLN
4.0000 mg | Freq: Once | INTRAMUSCULAR | Status: AC
  Administered 2016-12-02: 4 mg via INTRAVENOUS
  Filled 2016-12-02: qty 2

## 2016-12-02 MED ORDER — KETOROLAC TROMETHAMINE 30 MG/ML IJ SOLN
30.0000 mg | Freq: Once | INTRAMUSCULAR | Status: AC
  Administered 2016-12-02: 30 mg via INTRAVENOUS
  Filled 2016-12-02: qty 1

## 2016-12-02 MED ORDER — CIPROFLOXACIN HCL 500 MG PO TABS: 500.0000 mg | ORAL_TABLET | Freq: Two times a day (BID) | ORAL | 0 refills | Status: DC

## 2016-12-02 MED ORDER — METRONIDAZOLE IN NACL 5-0.79 MG/ML-% IV SOLN
500.0000 mg | Freq: Once | INTRAVENOUS | Status: AC
  Administered 2016-12-02: 500 mg via INTRAVENOUS
  Filled 2016-12-02: qty 100

## 2016-12-02 MED ORDER — CIPROFLOXACIN IN D5W 400 MG/200ML IV SOLN
400.0000 mg | Freq: Once | INTRAVENOUS | Status: AC
  Administered 2016-12-02: 400 mg via INTRAVENOUS
  Filled 2016-12-02: qty 200

## 2016-12-02 MED ORDER — IBUPROFEN 800 MG PO TABS: 800.0000 mg | ORAL_TABLET | Freq: Three times a day (TID) | ORAL | 0 refills | Status: DC

## 2016-12-02 MED ORDER — METRONIDAZOLE 500 MG PO TABS: 500.0000 mg | ORAL_TABLET | Freq: Two times a day (BID) | ORAL | 0 refills | Status: DC

## 2016-12-02 NOTE — ED Triage Notes (Signed)
Pain in lower back last night, now is in suprapubic area, states he has been urinating blood for a couple of weeks without pain, has had multiple kidney stones in the past

## 2016-12-02 NOTE — Discharge Instructions (Signed)
You may have passed a kidney stone. Take the antibiotics for diverticulitis as prescribed. Follow up with your doctor. Return to the ED if you develop new or worsening symptoms.

## 2016-12-02 NOTE — ED Notes (Signed)
Pt states understanding of care given and follow up instructions.  Pt a/o ambulated from ED.  Instructed pt not to drive a vehicle.  Pt states he will be walking home since he lives 2 blocks from hospital

## 2016-12-02 NOTE — ED Provider Notes (Signed)
[facility] DEPT Provider Note   CSN: [id number] Arrival date & time: 12/02/16  0145     History   Chief Complaint Chief Complaint  Patient presents with  . Abdominal Pain    HPI Patrick Herrera is a 61 y.o. male.  Patient with history of kidney stones and chronic pain presenting with immature here for several weeks. Has had intermittent low back pain for the past 9 or 10 days but progressed to move to his abdomen over the past several days. He ran out of his pain medication at home and has been taking pain medication from the neighbors. He denies any nausea or vomiting. Denies any fever. Does have pain with urination and hematuria. Denies any chest pain or shortness of breath. No testicular pain.   The history is provided by the patient.  Abdominal Pain   Associated symptoms include nausea, dysuria and hematuria. Pertinent negatives include diarrhea, vomiting, headaches and arthralgias.    Past Medical History:  Diagnosis Date  . Chronic pain   . Kidney stones     There are no active problems to display for this patient.   Past Surgical History:  Procedure Laterality Date  . bil knee surgery    . ELBOW SURGERY    . NECK SURGERY    . SHOULDER SURGERY         Home Medications    Prior to Admission medications   Medication Sig Start Date End Date Taking? Authorizing Provider  ALPRAZolam Prudy Feeler) 1 MG tablet Take 1 mg by mouth 4 (four) times daily as needed. anxiety    [provider]  ciprofloxacin (CIPRO) 500 MG tablet Take 1 tablet (500 mg total) by mouth 2 (two) times daily. For 7 days 05/24/12   Triplett, Tammy, PA-C  ibuprofen (ADVIL) 200 MG tablet Take 400 mg by mouth every 6 (six) hours as needed. pain    [provider]  methocarbamol (ROBAXIN) 500 MG tablet Take 1 tablet (500 mg total) by mouth 3 (three) times daily. 05/20/14   Ivery Quale, PA-C  methocarbamol (ROBAXIN) 500 MG tablet Take 1 tablet (500 mg total) by mouth 3 (three)  times daily. 05/20/14   Ivery Quale, PA-C  ondansetron (ZOFRAN) 8 MG tablet Take 1 tablet (8 mg total) by mouth every 8 (eight) hours as needed for nausea. 05/24/12   Triplett, Tammy, PA-C  oxyCODONE-acetaminophen (TYLOX) 5-500 MG per capsule Take 1 capsule by mouth every 6 (six) hours as needed for pain. 05/20/14   Ivery Quale, PA-C  oxyCODONE-acetaminophen (TYLOX) 5-500 MG per capsule Take 1 capsule by mouth every 6 (six) hours as needed for pain. 05/20/14   Ivery Quale, PA-C    Family History No family history on file.  Social History Social History  Substance Use Topics  . Smoking status: Former Smoker    Types: Cigarettes  . Smokeless tobacco: Not on file  . Alcohol use Yes     Allergies   Penicillins   Review of Systems Review of Systems  Constitutional: Negative for activity change and appetite change.  HENT: Negative for congestion and rhinorrhea.   Eyes: Negative for visual disturbance.  Respiratory: Negative for cough, chest tightness and shortness of breath.   Gastrointestinal: Positive for abdominal pain and nausea. Negative for diarrhea and vomiting.  Genitourinary: Positive for dysuria, flank pain and hematuria. Negative for testicular pain and urgency.  Musculoskeletal: Positive for back pain. Negative for arthralgias.  Skin: Negative for rash.  Neurological: Negative for dizziness, weakness and  headaches.    all other systems are negative except as noted in the HPI and PMH.    Physical Exam Updated Vital Signs BP (!) 165/103 (BP Location: Right Arm)   Pulse 76   Temp 97.8 F (36.6 C) (Oral)   Resp 20   Ht 5\' 8"  (1.727 m)   Wt 99.8 kg (220 lb)   SpO2 98%   BMI 33.45 kg/m   Physical Exam  Constitutional: He is oriented to person, place, and time. He appears well-developed and well-nourished. No distress.  Uncomfortable. Standing on side of bed  HENT:  Head: Normocephalic and atraumatic.  Mouth/Throat: Oropharynx is clear and moist. No  oropharyngeal exudate.  Eyes: Conjunctivae and EOM are normal. Pupils are equal, round, and reactive to light.  Neck: Normal range of motion. Neck supple.  No meningismus.  Cardiovascular: Normal rate, regular rhythm, normal heart sounds and intact distal pulses.   No murmur heard. Pulmonary/Chest: Effort normal and breath sounds normal. No respiratory distress.  Abdominal: Soft. There is tenderness. There is no rebound and no guarding.  Suprapubic tenderness  Genitourinary:  Genitourinary Comments: No testicular tenderness  Musculoskeletal: Normal range of motion. He exhibits no edema or tenderness.  No CVAT  Neurological: He is alert and oriented to person, place, and time. No cranial nerve deficit. He exhibits normal muscle tone. Coordination normal.  No ataxia on finger to nose bilaterally. No pronator drift. 5/5 strength throughout. CN 2-12 intact.Equal grip strength. Sensation intact.   Skin: Skin is warm.  Psychiatric: He has a normal mood and affect. His behavior is normal.  Nursing note and vitals reviewed.    ED Treatments / Results  Labs (all labs ordered are listed, but only abnormal results are displayed) Labs Reviewed  URINALYSIS, ROUTINE W REFLEX MICROSCOPIC - Abnormal; Notable for the following:       Result Value   APPearance HAZY (*)    Hgb urine dipstick LARGE (*)    Protein, ur 30 (*)    Squamous Epithelial / LPF 0-5 (*)    All other components within normal limits  BASIC METABOLIC PANEL - Abnormal; Notable for the following:    Potassium 3.4 (*)    Glucose, Bld 115 (*)    All other components within normal limits  CBC WITH DIFFERENTIAL/PLATELET    EKG  EKG Interpretation None       Radiology Ct Renal Stone Study  Result Date: 12/02/2016 CLINICAL DATA:  Lower back pain last night, it now migrated to the suprapubic region. EXAM: CT ABDOMEN AND PELVIS WITHOUT CONTRAST TECHNIQUE: Multidetector CT imaging of the abdomen and pelvis was performed  following the standard protocol without IV contrast. COMPARISON:  05/24/2012 FINDINGS: Lower chest: Moderate hiatal hernia. Hepatobiliary: Low-attenuation lesion in the inferior right hepatic lobe corresponds to the benign lesions observed on 05/14/2005. 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 No suspicious liver lesions. Gallbladder and bile ducts are unremarkable. Pancreas: Unremarkable. No pancreatic ductal dilatation or surrounding inflammatory changes. Spleen: Normal in size without focal abnormality. Adrenals/Urinary Tract: Adrenal glands are unremarkable. Kidneys are normal, without renal calculi, focal lesion, or hydronephrosis. Bladder is unremarkable. Stomach/Bowel: Acute inflammatory changes surround a diverticulum of the distal descending colon with appearances typical of acute diverticulitis. No abscess. Hiatal hernia.  Stomach and small bowel are otherwise normal. Appendix is normal.  Extensive colonic diverticulosis. Vascular/Lymphatic: The abdominal aorta is normal in caliber with moderate atherosclerotic calcification. No adenopathy. Reproductive: Unremarkable Other: No ascites Musculoskeletal: No significant skeletal lesion. IMPRESSION: 1. Acute diverticulitis,  distal descending colon. No abscess. Extensive diverticulosis. 2. Benign lesion of the right hepatic lobe inferiorly, unchanged from 2006. 3. Aortic atherosclerosis. 4. Hiatal hernia. Electronically Signed   By: Ellery Plunkaniel R Mitchell M.D.   On: 12/02/2016 03:41    Procedures Procedures (including critical care time)  Medications Ordered in ED Medications  sodium chloride 0.9 % bolus 1,000 mL (1,000 mLs Intravenous New Bag/Given 12/02/16 0245)  HYDROmorphone (DILAUDID) injection 1 mg (1 mg Intravenous Given 12/02/16 0249)  ondansetron (ZOFRAN) injection 4 mg (4 mg Intravenous Given 12/02/16 0249)  ketorolac (TORADOL) 30 MG/ML injection 30 mg (30 mg Intravenous Given 12/02/16 0247)     Initial Impression / Assessment and Plan / ED Course  I  have reviewed the triage vital signs and the nursing notes.  Pertinent labs & imaging results that were available during my care of the patient were reviewed by me and considered in my medical decision making (see chart for details).   patient with one week of hematuria associated with low back pain of this since moved to his lower abdomen. Feels like previous kidney stones.  Patient does have hematuria on UA. No infection. CT scan shows no kidney stone. May have passed one already. Does show incidental diverticulitis. Patient denies diarrhea.  Pain is improved with treatment in the ED. He is coloring by mouth. No vomiting. Discussed CT findings with him. We'll start antibiotics for diverticulitis.  [employer] [employer] [place] reviewed.  Received 150 oxycodone 10 mg on June 4.   Follow-up with PCP. We'll not prescribe additional narcotics at this time. There is no kidney stone on patient's CT. We'll treat diverticulitis. PCP follow-up. Return precautions discussed. Final Clinical Impressions(s) / ED Diagnoses   Final diagnoses:  Flank pain  Diverticulitis large intestine w/o perforation or abscess w/o bleeding  Hematuria, unspecified type    New Prescriptions New Prescriptions   No medications on file     Glynn Octaveancour, Verlyn Dannenberg, MD 12/02/16 [phone]

## 2017-01-06 ENCOUNTER — Encounter (HOSPITAL_COMMUNITY): Payer: Self-pay | Admitting: Emergency Medicine

## 2017-01-06 ENCOUNTER — Emergency Department (HOSPITAL_COMMUNITY)
Admission: EM | Admit: 2017-01-06 | Discharge: 2017-01-06 | Disposition: A | Discharge status: Home / Self Care | Payer: Medicare HMO | Attending: Emergency Medicine | Admitting: Emergency Medicine

## 2017-01-06 DIAGNOSIS — L089 Local infection of the skin and subcutaneous tissue, unspecified: Secondary | ICD-10-CM

## 2017-01-06 DIAGNOSIS — Z79899 Other long term (current) drug therapy: Secondary | ICD-10-CM | POA: Insufficient documentation

## 2017-01-06 DIAGNOSIS — Z87891 Personal history of nicotine dependence: Secondary | ICD-10-CM | POA: Insufficient documentation

## 2017-01-06 DIAGNOSIS — L723 Sebaceous cyst: Secondary | ICD-10-CM | POA: Insufficient documentation

## 2017-01-06 MED ORDER — LIDOCAINE HCL (PF) 1 % IJ SOLN
10.0000 mL | Freq: Once | INTRAMUSCULAR | Status: AC
  Administered 2017-01-06: 10 mL
  Filled 2017-01-06: qty 10

## 2017-01-06 NOTE — Discharge Instructions (Signed)
Take the antibiotic your doctor prescribed you today.  Start applying warm water to the site (shower) for 10 minutes 2-3 times daily.

## 2017-01-06 NOTE — ED Triage Notes (Signed)
Pt has an abscess on his upper back.  He states that he noticed the abscess four weeks ago and has worsened since then.  He states that he was trying to rupture the abscess by pressing his back on the wall.  The abscess has become more painful and gotten larger.  Pt did have a doctor's appointment today and was referred here.

## 2017-01-06 NOTE — ED Notes (Signed)
Abscess upper back area small egg size

## 2017-01-08 NOTE — ED Provider Notes (Signed)
[facility] Provider Note   CSN: [id number] Arrival date & time: 01/06/17  1022     History   Chief Complaint Chief Complaint  Patient presents with  . Abscess    upper back    HPI Patrick Herrera is a 61 y.o. male presenting with and infected sebaceous cyst on his back.  He was seen by his pcp this am for this condition, was placed on Keflex and referred here for I&D. He has had this swelling for "a long time" but it became painful and red over the past 2 weeks.  He as been using warm compresses and hoping it would drain and go away, hence the delay in presentation. He denies fevers, chills or other symptoms.  HPI  Past Medical History:  Diagnosis Date  . Chronic pain   . Kidney stones     There are no active problems to display for this patient.   Past Surgical History:  Procedure Laterality Date  . bil knee surgery    . ELBOW SURGERY    . NECK SURGERY    . SHOULDER SURGERY         Home Medications    Prior to Admission medications   Medication Sig Start Date End Date Taking? Authorizing Provider  ALPRAZolam Prudy Feeler(XANAX) 1 MG tablet Take 1 mg by mouth 4 (four) times daily as needed. anxiety   Yes [provider]  omeprazole (PRILOSEC) 20 MG capsule Take 1 capsule by mouth daily. 11/24/16  Yes [provider]  Oxycodone HCl 10 MG TABS Take 1 tablet by mouth 5 (five) times daily as needed for pain. 01/06/17  Yes [provider]  ciprofloxacin (CIPRO) 500 MG tablet Take 1 tablet (500 mg total) by mouth 2 (two) times daily. Patient not taking: Reported on 01/06/2017 12/02/16   Glynn Octaveancour, Stephen, MD  ibuprofen (ADVIL,MOTRIN) 800 MG tablet Take 1 tablet (800 mg total) by mouth 3 (three) times daily. Patient not taking: Reported on 01/06/2017 12/02/16   Glynn Octaveancour, Stephen, MD  metroNIDAZOLE (FLAGYL) 500 MG tablet Take 1 tablet (500 mg total) by mouth 2 (two) times daily. Patient not taking: Reported on 01/06/2017 12/02/16   Glynn Octaveancour, Stephen, MD    Family  History No family history on file.  Social History Social History  Substance Use Topics  . Smoking status: Former Smoker    Types: Cigarettes  . Smokeless tobacco: Never Used  . Alcohol use Yes     Allergies   Penicillins   Review of Systems Review of Systems  Constitutional: Negative for chills and fever.  Gastrointestinal: Negative.   Musculoskeletal: Negative.  Negative for myalgias.  Skin: Positive for color change.       Negative except as mentioned in HPI.      Physical Exam Updated Vital Signs BP (!) 121/108   Pulse 73   Temp 97.8 F (36.6 C) (Oral)   Resp 18   Ht 5\' 8"  (1.727 m)   Wt 96.6 kg (213 lb)   SpO2 98%   BMI 32.39 kg/m   Physical Exam  Constitutional: He appears well-developed and well-nourished. No distress.  HENT:  Head: Normocephalic.  Neck: Normal range of motion. Neck supple.  Cardiovascular: Normal rate.   Pulmonary/Chest: Effort normal. He has no wheezes.  Musculoskeletal: Normal range of motion. He exhibits no edema.  Skin: No rash noted.  Large raised, erythematous and tender sebaceous cyst upper back, midline, approximately 10 cm x 4 cm.  No surrounding erythema.  Large "blackhead"  at the superior aspect, but pointing with trace drainage at inferior aspect of the cyst.      ED Treatments / Results  Labs (all labs ordered are listed, but only abnormal results are displayed) Labs Reviewed - No data to display  EKG  EKG Interpretation None       Radiology No results found.  Procedures Procedures (including critical care time)  INCISION AND DRAINAGE Performed by: Burgess AmorIDOL, Tionne Carelli Consent: Verbal consent obtained. Risks and benefits: risks, benefits and alternatives were discussed Type: abscess  Body area: upper back  Anesthesia: local infiltration  Incision was made with a scalpel.  Local anesthetic: lidocaine 1% without epinephrine  Anesthetic total: 10 ml  Complexity: complex Blunt dissection to break up  loculations  Drainage: purulent with copious sebum  Drainage amount: copious  Packing material: none Patient tolerance: Patient tolerated the procedure well with no immediate complications.     Medications Ordered in ED Medications  lidocaine (PF) (XYLOCAINE) 1 % injection 10 mL (10 mLs Other Given 01/06/17 1230)     Initial Impression / Assessment and Plan / ED Course  I have reviewed the triage vital signs and the nursing notes.  Pertinent labs & imaging results that were available during my care of the patient were reviewed by me and considered in my medical decision making (see chart for details).     Advised aggressive warm soaks/shower massage to site.  Dressing.  abx as prescribed by pcp. Recheck with pcp for any problems or concerns as this site heals.  Discussed possible need for surgical excision of site if it re-forms.   Final Clinical Impressions(s) / ED Diagnoses   Final diagnoses:  Infected sebaceous cyst    New Prescriptions Discharge Medication List as of 01/06/2017  1:10 PM       Burgess AmorIdol, Ivin Rosenbloom, PA-C 01/08/17 0947    Samuel JesterMcManus, Kathleen, DO 01/09/17 [id number]

## 2017-02-07 ENCOUNTER — Encounter (HOSPITAL_COMMUNITY): Payer: Self-pay | Admitting: Emergency Medicine

## 2017-02-07 DIAGNOSIS — W1789XA Other fall from one level to another, initial encounter: Secondary | ICD-10-CM | POA: Diagnosis not present

## 2017-02-07 DIAGNOSIS — Y939 Activity, unspecified: Secondary | ICD-10-CM | POA: Insufficient documentation

## 2017-02-07 DIAGNOSIS — Y999 Unspecified external cause status: Secondary | ICD-10-CM | POA: Diagnosis not present

## 2017-02-07 DIAGNOSIS — Y929 Unspecified place or not applicable: Secondary | ICD-10-CM | POA: Insufficient documentation

## 2017-02-07 DIAGNOSIS — S8992XA Unspecified injury of left lower leg, initial encounter: Secondary | ICD-10-CM | POA: Insufficient documentation

## 2017-02-07 DIAGNOSIS — Z5321 Procedure and treatment not carried out due to patient leaving prior to being seen by health care provider: Secondary | ICD-10-CM | POA: Diagnosis not present

## 2017-02-07 NOTE — ED Triage Notes (Addendum)
Patient has large puncture wound to left lateral leg. Small amount of bleeding noted. Per patient fell of stool but unsure what punctured his leg. Denies taking any blood thinners. Unsure of last tetanus vaccination. Denies hitting head or LOC.

## 2017-02-08 ENCOUNTER — Emergency Department (HOSPITAL_COMMUNITY)
Admission: EM | Admit: 2017-02-08 | Discharge: 2017-02-08 | Disposition: A | Discharge status: Home / Self Care | Payer: Medicare HMO | Attending: Emergency Medicine | Admitting: Emergency Medicine

## 2017-02-08 NOTE — ED Notes (Signed)
Called x1, no answer

## 2017-02-08 NOTE — ED Notes (Signed)
No answer

## 2017-12-20 DIAGNOSIS — Z79899 Other long term (current) drug therapy: Secondary | ICD-10-CM | POA: Diagnosis not present

## 2017-12-20 DIAGNOSIS — G894 Chronic pain syndrome: Secondary | ICD-10-CM | POA: Diagnosis not present

## 2017-12-20 DIAGNOSIS — M5090 Cervical disc disorder, unspecified, unspecified cervical region: Secondary | ICD-10-CM | POA: Diagnosis not present

## 2017-12-20 DIAGNOSIS — R6889 Other general symptoms and signs: Secondary | ICD-10-CM | POA: Diagnosis not present

## 2017-12-21 DIAGNOSIS — E662 Morbid (severe) obesity with alveolar hypoventilation: Secondary | ICD-10-CM | POA: Diagnosis not present

## 2017-12-21 DIAGNOSIS — M2550 Pain in unspecified joint: Secondary | ICD-10-CM | POA: Diagnosis not present

## 2017-12-21 DIAGNOSIS — E1165 Type 2 diabetes mellitus with hyperglycemia: Secondary | ICD-10-CM | POA: Diagnosis not present

## 2017-12-21 DIAGNOSIS — K219 Gastro-esophageal reflux disease without esophagitis: Secondary | ICD-10-CM | POA: Diagnosis not present

## 2018-01-03 DIAGNOSIS — Z79899 Other long term (current) drug therapy: Secondary | ICD-10-CM | POA: Diagnosis not present

## 2018-01-03 DIAGNOSIS — G894 Chronic pain syndrome: Secondary | ICD-10-CM | POA: Diagnosis not present

## 2018-01-03 DIAGNOSIS — M5090 Cervical disc disorder, unspecified, unspecified cervical region: Secondary | ICD-10-CM | POA: Diagnosis not present

## 2018-01-03 DIAGNOSIS — F1120 Opioid dependence, uncomplicated: Secondary | ICD-10-CM | POA: Diagnosis not present

## 2018-01-12 DIAGNOSIS — E669 Obesity, unspecified: Secondary | ICD-10-CM | POA: Diagnosis not present

## 2018-01-12 DIAGNOSIS — K219 Gastro-esophageal reflux disease without esophagitis: Secondary | ICD-10-CM | POA: Diagnosis not present

## 2018-01-12 DIAGNOSIS — L409 Psoriasis, unspecified: Secondary | ICD-10-CM | POA: Diagnosis not present

## 2018-01-12 DIAGNOSIS — G8929 Other chronic pain: Secondary | ICD-10-CM | POA: Diagnosis not present

## 2018-01-12 DIAGNOSIS — Z87442 Personal history of urinary calculi: Secondary | ICD-10-CM | POA: Diagnosis not present

## 2018-01-12 DIAGNOSIS — Z6832 Body mass index (BMI) 32.0-32.9, adult: Secondary | ICD-10-CM | POA: Diagnosis not present

## 2018-01-12 DIAGNOSIS — F419 Anxiety disorder, unspecified: Secondary | ICD-10-CM | POA: Diagnosis not present

## 2018-01-12 DIAGNOSIS — R0989 Other specified symptoms and signs involving the circulatory and respiratory systems: Secondary | ICD-10-CM | POA: Diagnosis not present

## 2018-01-22 DIAGNOSIS — E559 Vitamin D deficiency, unspecified: Secondary | ICD-10-CM | POA: Diagnosis not present

## 2018-01-22 DIAGNOSIS — I110 Hypertensive heart disease with heart failure: Secondary | ICD-10-CM | POA: Diagnosis not present

## 2018-01-22 DIAGNOSIS — R5382 Chronic fatigue, unspecified: Secondary | ICD-10-CM | POA: Diagnosis not present

## 2018-01-22 DIAGNOSIS — E1165 Type 2 diabetes mellitus with hyperglycemia: Secondary | ICD-10-CM | POA: Diagnosis not present

## 2018-01-22 DIAGNOSIS — K219 Gastro-esophageal reflux disease without esophagitis: Secondary | ICD-10-CM | POA: Diagnosis not present

## 2018-02-01 DIAGNOSIS — M1990 Unspecified osteoarthritis, unspecified site: Secondary | ICD-10-CM | POA: Diagnosis not present

## 2018-02-01 DIAGNOSIS — M25511 Pain in right shoulder: Secondary | ICD-10-CM | POA: Diagnosis not present

## 2018-02-01 DIAGNOSIS — Z79899 Other long term (current) drug therapy: Secondary | ICD-10-CM | POA: Diagnosis not present

## 2018-02-01 DIAGNOSIS — G894 Chronic pain syndrome: Secondary | ICD-10-CM | POA: Diagnosis not present

## 2018-02-21 DIAGNOSIS — K219 Gastro-esophageal reflux disease without esophagitis: Secondary | ICD-10-CM | POA: Diagnosis not present

## 2018-02-21 DIAGNOSIS — M2550 Pain in unspecified joint: Secondary | ICD-10-CM | POA: Diagnosis not present

## 2018-02-21 DIAGNOSIS — R5382 Chronic fatigue, unspecified: Secondary | ICD-10-CM | POA: Diagnosis not present

## 2018-02-21 DIAGNOSIS — I110 Hypertensive heart disease with heart failure: Secondary | ICD-10-CM | POA: Diagnosis not present

## 2018-03-02 DIAGNOSIS — M5090 Cervical disc disorder, unspecified, unspecified cervical region: Secondary | ICD-10-CM | POA: Diagnosis not present

## 2018-03-02 DIAGNOSIS — F1120 Opioid dependence, uncomplicated: Secondary | ICD-10-CM | POA: Diagnosis not present

## 2018-03-02 DIAGNOSIS — Z79899 Other long term (current) drug therapy: Secondary | ICD-10-CM | POA: Diagnosis not present

## 2018-03-22 DIAGNOSIS — M545 Low back pain: Secondary | ICD-10-CM | POA: Diagnosis not present

## 2018-03-22 DIAGNOSIS — E1165 Type 2 diabetes mellitus with hyperglycemia: Secondary | ICD-10-CM | POA: Diagnosis not present

## 2018-03-22 DIAGNOSIS — G894 Chronic pain syndrome: Secondary | ICD-10-CM | POA: Diagnosis not present

## 2018-03-22 DIAGNOSIS — M2550 Pain in unspecified joint: Secondary | ICD-10-CM | POA: Diagnosis not present

## 2018-03-30 DIAGNOSIS — Z79899 Other long term (current) drug therapy: Secondary | ICD-10-CM | POA: Diagnosis not present

## 2018-03-30 DIAGNOSIS — F1120 Opioid dependence, uncomplicated: Secondary | ICD-10-CM | POA: Diagnosis not present

## 2018-03-30 DIAGNOSIS — M5090 Cervical disc disorder, unspecified, unspecified cervical region: Secondary | ICD-10-CM | POA: Diagnosis not present

## 2019-03-28 IMAGING — CT CT RENAL STONE PROTOCOL
2 of 4 series · 16 of 46 positions shown, 18 images · non-contrast
Comparison: 05/24/2012

CLINICAL DATA: Lower back pain last night, it now migrated to the
suprapubic region.

EXAM:
CT ABDOMEN AND PELVIS WITHOUT CONTRAST
TECHNIQUE: Multidetector CT imaging of the abdomen and pelvis was performed
following the standard protocol without IV contrast.

[Series 2: axial st · axial · 0.90mm/px · z∈[+976,+1386]mm · 13 of 90 slices shown, 15 images]
[im 4/90  soft-tissue]
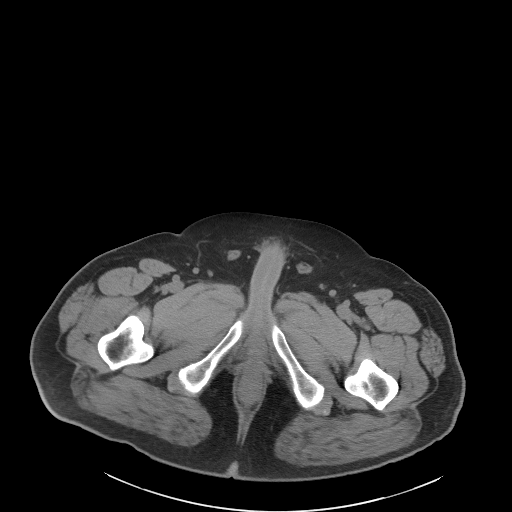
[im 4/90  bone]
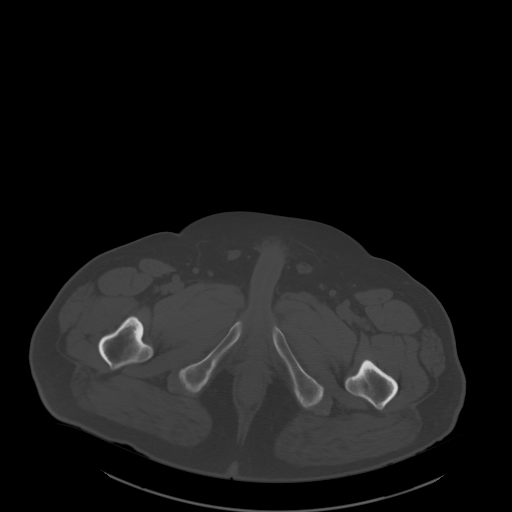
[im 12/90  soft-tissue]
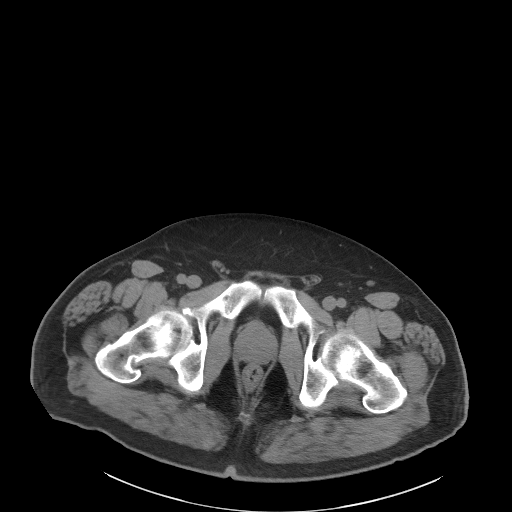
[im 20/90  soft-tissue]
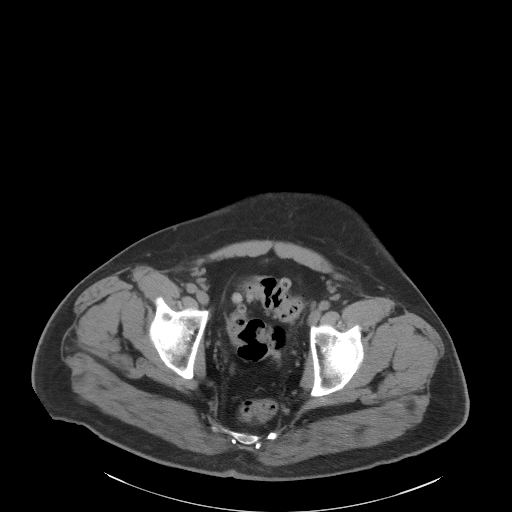
[im 24/90  soft-tissue]
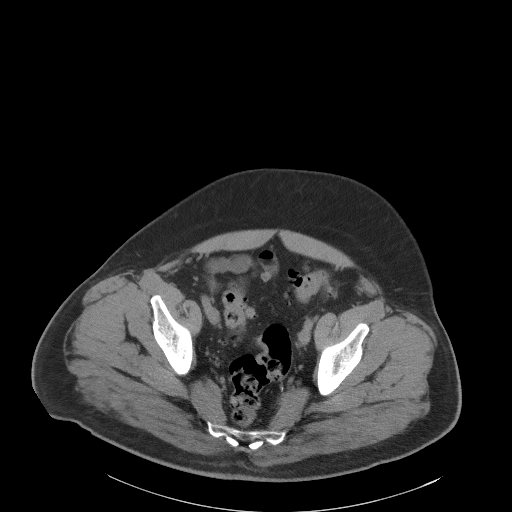
[im 31/90  soft-tissue]
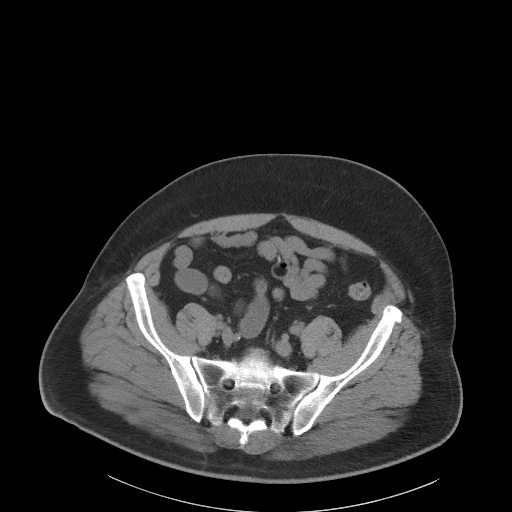
[im 39/90  soft-tissue]
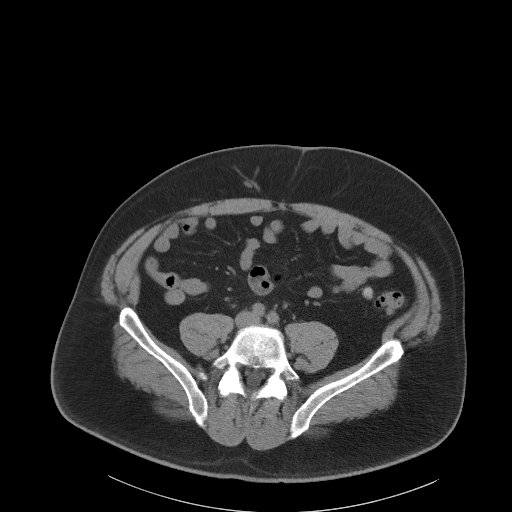
[im 47/90  soft-tissue]
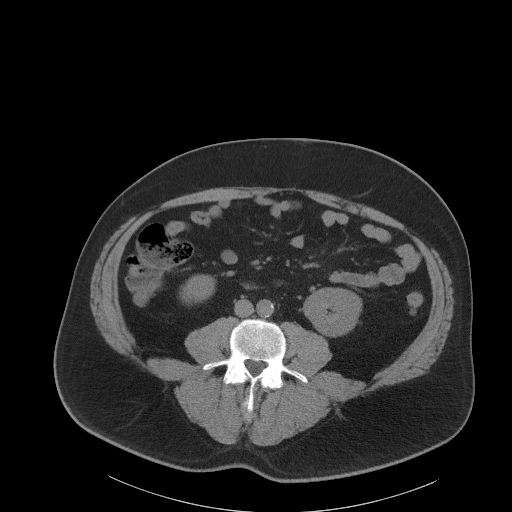
[im 51/90  soft-tissue]
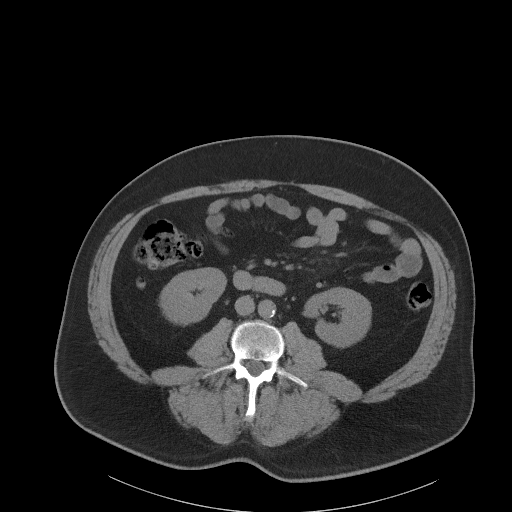
[im 59/90  soft-tissue]
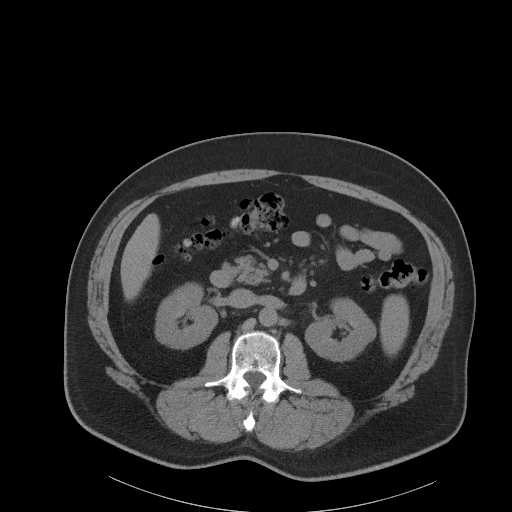
[im 59/90  bone]
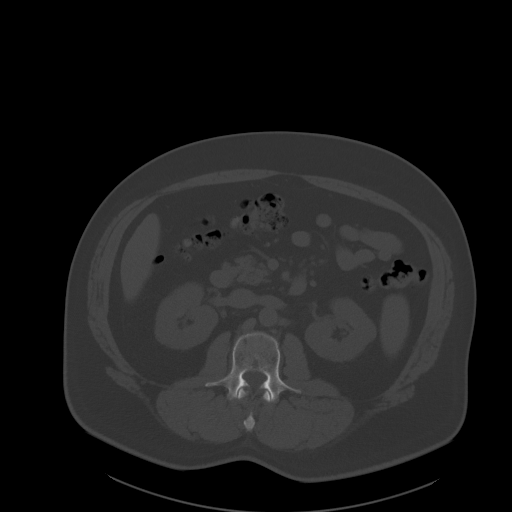
[im 66/90  soft-tissue]
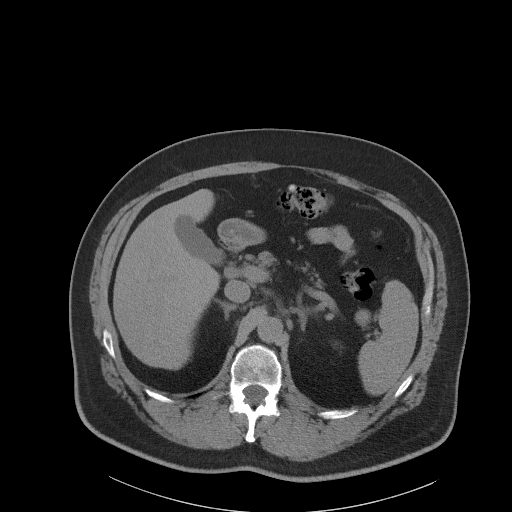
[im 70/90  soft-tissue]
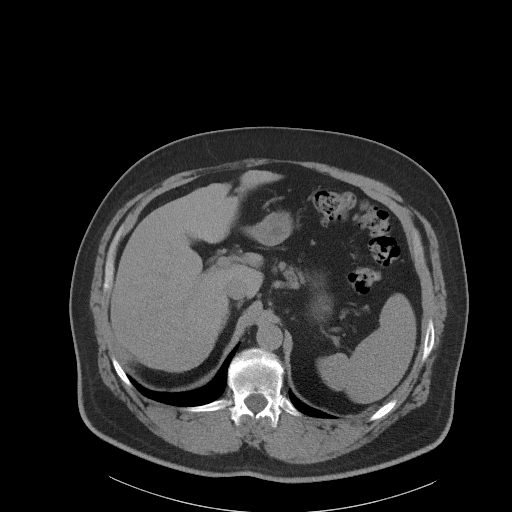
[im 78/90  soft-tissue]
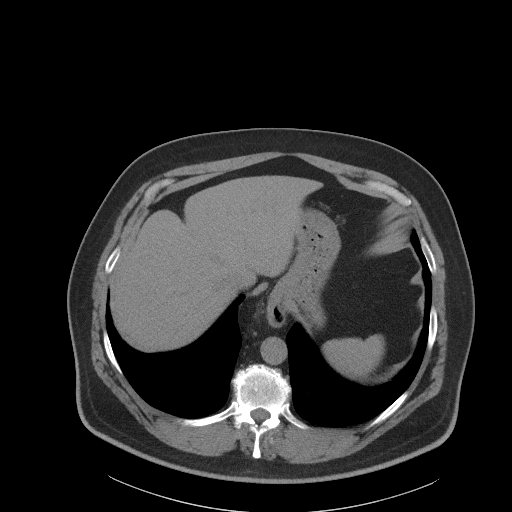
[im 86/90  soft-tissue]
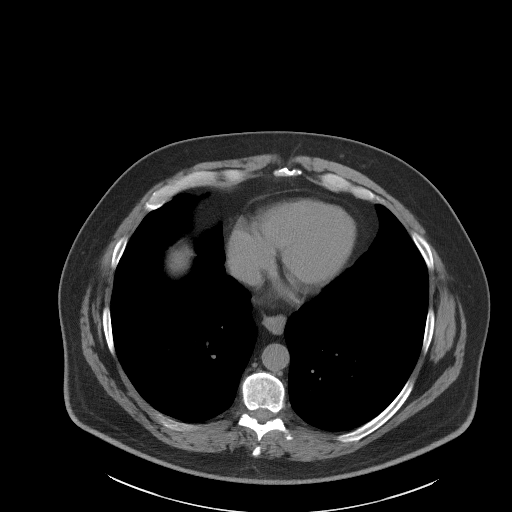

[Series 5: coronal st · coronal · 0.75mm/px · 3 of 101 slices shown]
[im 34/101  soft-tissue]
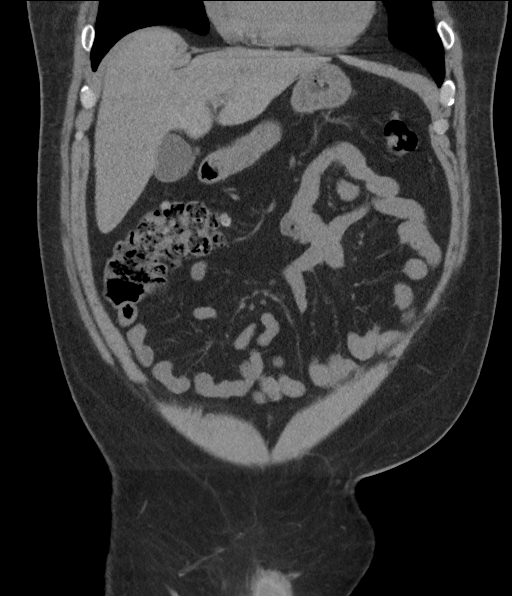
[im 45/101  soft-tissue]
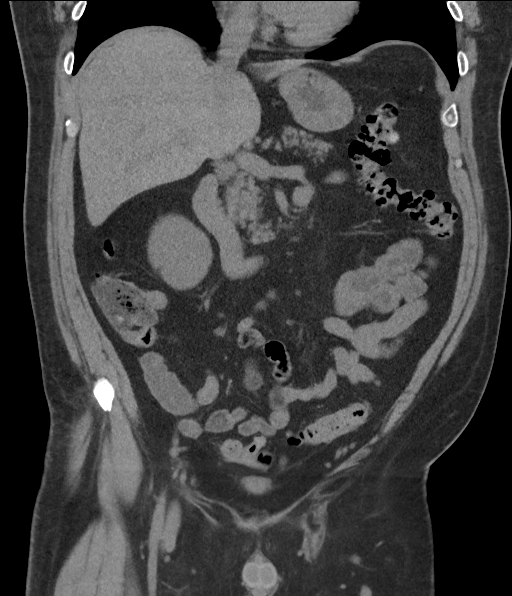
[im 56/101  soft-tissue]
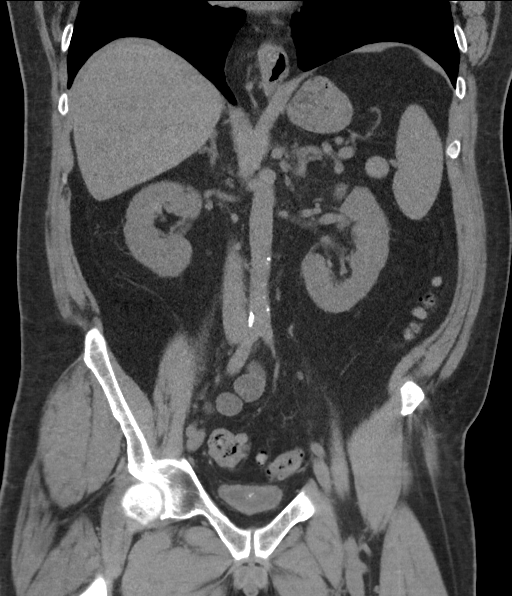

[16 of 46 positions shown; findings below may reference images not displayed]

FINDINGS: Lower chest: Moderate hiatal hernia.

Hepatobiliary: Low-attenuation lesion in the inferior right hepatic
lobe corresponds to the benign lesions observed on 05/14/2005. These
probably are hemangiomata. No suspicious liver lesions. Gallbladder
and bile ducts are unremarkable.

Pancreas: Unremarkable. No pancreatic ductal dilatation or
surrounding inflammatory changes.

Spleen: Normal in size without focal abnormality.

Adrenals/Urinary Tract: Adrenal glands are unremarkable. Kidneys are
normal, without renal calculi, focal lesion, or hydronephrosis.
Bladder is unremarkable.

Stomach/Bowel: Acute inflammatory changes surround a diverticulum of
the distal descending colon with appearances typical of acute
diverticulitis. No abscess.

Hiatal hernia.  Stomach and small bowel are otherwise normal.

Appendix is normal.  Extensive colonic diverticulosis.

Vascular/Lymphatic: The abdominal aorta is normal in caliber with
moderate atherosclerotic calcification. No adenopathy.

Reproductive: Unremarkable

Other: No ascites

Musculoskeletal: No significant skeletal lesion.
IMPRESSION: 1. Acute diverticulitis, distal descending colon. No abscess.
Extensive diverticulosis.
2. Benign lesion of the right hepatic lobe inferiorly, unchanged
from 0443.
3. Aortic atherosclerosis.
4. Hiatal hernia.

## 2019-10-14 DIAGNOSIS — M25511 Pain in right shoulder: Secondary | ICD-10-CM | POA: Insufficient documentation

## 2019-11-18 ENCOUNTER — Emergency Department (HOSPITAL_COMMUNITY)
Admission: EM | Admit: 2019-11-18 | Discharge: 2019-11-18 | Disposition: A | Discharge status: Home / Self Care | Payer: Medicare HMO | Attending: Emergency Medicine | Admitting: Emergency Medicine

## 2019-11-18 ENCOUNTER — Encounter (HOSPITAL_COMMUNITY): Payer: Self-pay | Admitting: *Deleted

## 2019-11-18 ENCOUNTER — Other Ambulatory Visit: Payer: Self-pay

## 2019-11-18 DIAGNOSIS — M7581 Other shoulder lesions, right shoulder: Secondary | ICD-10-CM

## 2019-11-18 DIAGNOSIS — F1290 Cannabis use, unspecified, uncomplicated: Secondary | ICD-10-CM | POA: Diagnosis not present

## 2019-11-18 DIAGNOSIS — Z880 Allergy status to penicillin: Secondary | ICD-10-CM | POA: Diagnosis not present

## 2019-11-18 DIAGNOSIS — Z87891 Personal history of nicotine dependence: Secondary | ICD-10-CM | POA: Diagnosis not present

## 2019-11-18 DIAGNOSIS — M25519 Pain in unspecified shoulder: Secondary | ICD-10-CM | POA: Diagnosis present

## 2019-11-18 MED ORDER — ONDANSETRON 4 MG PO TBDP
4.0000 mg | ORAL_TABLET | Freq: Once | ORAL | Status: AC
  Administered 2019-11-18: 4 mg via ORAL
  Filled 2019-11-18: qty 1

## 2019-11-18 MED ORDER — MELOXICAM 7.5 MG PO TABS: 7.5000 mg | ORAL_TABLET | Freq: Every day | ORAL | 0 refills | Status: DC

## 2019-11-18 MED ORDER — ACETAMINOPHEN ER 650 MG PO TBCR: 650.0000 mg | EXTENDED_RELEASE_TABLET | Freq: Three times a day (TID) | ORAL | 0 refills | Status: DC | PRN

## 2019-11-18 MED ORDER — LIDOCAINE 4 % EX PTCH: 1.0000 | MEDICATED_PATCH | Freq: Two times a day (BID) | CUTANEOUS | 0 refills | Status: DC

## 2019-11-18 MED ORDER — NAPROXEN 250 MG PO TABS
500.0000 mg | ORAL_TABLET | Freq: Once | ORAL | Status: AC
  Administered 2019-11-18: 500 mg via ORAL
  Filled 2019-11-18: qty 2

## 2019-11-18 MED ORDER — HYDROCODONE-ACETAMINOPHEN 5-325 MG PO TABS
1.0000 | ORAL_TABLET | Freq: Once | ORAL | Status: AC
  Administered 2019-11-18: 1 via ORAL
  Filled 2019-11-18: qty 1

## 2019-11-18 NOTE — Discharge Instructions (Addendum)
Follow-up with your primary care doctor for management of chronic pain and follow-up with the orthopedic surgeon for definitive treatment options for your shoulder injury.  Please take Tylenol around-the-clock, Mobic as prescribed and also apply topical medicine for pain control. Ice therapy 4 times a day for 10 minutes should also be helpful.

## 2019-11-18 NOTE — ED Triage Notes (Addendum)
Pt c/o chronic right shoulder pain x 1.5 years. Pt has had 3 surgeries on this same shoulder, last surgery in 2012. Pt saw Dr. Rennis Chris 3 weeks ago and was told he needed to have rotary cuff surgery but needed to have a referral done. Pt reports he doesn't have a PCP to get the referral. Pt also reports he doesn't want to see this same surgeon due to problems with the way he "numbed" him last night.   Pt reports his main concern is needing to get a referral to another orthopedic surgeon and to get "some pain relief".

## 2019-11-18 NOTE — ED Provider Notes (Signed)
[facility] EMERGENCY DEPARTMENT Provider Note   CSN: [id number] Arrival date & time: 11/18/19  8416     History Chief Complaint  Patrick Herrera presents with  . Shoulder Pain    Patrick Herrera ANTOLIN is a 64 y.o. male.  HPI     64 year Herrera male comes in a chief complaint of shoulder pain. Patrick Herrera has known history of right-sided shoulder tendinopathy.  Patrick Herrera has been recommended to undergo rotator cuff surgery, but has delayed it.  He started having worsening shoulder pain recently and is requesting pain control and follow-up with another orthopedist for a second opinion in a different approach on surgical repair.  He has no associated numbness, tingling.  Patrick Herrera has been taking over-the-counter medication without relief.  Past Medical History:  Diagnosis Date  . Chronic pain   . Kidney stones     Patrick Herrera Active Problem List   Diagnosis Date Noted  . Pain in joint of right shoulder 10/14/2019    Past Surgical History:  Procedure Laterality Date  . bil knee surgery    . ELBOW SURGERY    . NECK SURGERY    . SHOULDER SURGERY         No family history on file.  Social History   Tobacco Use  . Smoking status: Former Smoker    Types: Cigarettes  . Smokeless tobacco: Never Used  Vaping Use  . Vaping Use: Never used  Substance Use Topics  . Alcohol use: Yes    Comment: "3 or 4 double shots every 3-4 weeks"   . Drug use: Yes    Types: Marijuana    Home Medications Prior to Admission medications   Medication Sig Start Date End Date Taking? Authorizing Provider  acetaminophen (TYLENOL 8 HOUR) 650 MG CR tablet Take 1 tablet (650 mg total) by mouth every 8 (eight) hours as needed for pain or fever. 11/18/19   Varney Biles, MD  ALPRAZolam Duanne Moron) 1 MG tablet Take 1 mg by mouth 4 (four) times daily as needed. anxiety    [provider]  ciprofloxacin (CIPRO) 500 MG tablet Take 1 tablet (500 mg total) by mouth 2 (two) times daily. Patrick Herrera not taking: Reported on  01/06/2017 12/02/16   Ezequiel Essex, MD  meloxicam (MOBIC) 7.5 MG tablet Take 1 tablet (7.5 mg total) by mouth daily. 11/18/19   Varney Biles, MD  metroNIDAZOLE (FLAGYL) 500 MG tablet Take 1 tablet (500 mg total) by mouth 2 (two) times daily. Patrick Herrera not taking: Reported on 01/06/2017 12/02/16   Ezequiel Essex, MD  omeprazole (PRILOSEC) 20 MG capsule Take 1 capsule by mouth daily. 11/24/16   [provider]  Oxycodone HCl 10 MG TABS Take 1 tablet by mouth 5 (five) times daily as needed for pain. 01/06/17   [provider]    Allergies    Fentanyl, Oxycodone, and Penicillins  Review of Systems   Review of Systems  Musculoskeletal: Positive for arthralgias.  Neurological: Negative for numbness.    Physical Exam Updated Vital Signs BP (!) 129/94 (BP Location: Left Arm)   Pulse 99   Temp 98.6 F (37 C) (Oral)   Resp 16   Ht 5' 8.5" (1.74 m)   Wt 95.3 kg   SpO2 99%   BMI 31.47 kg/m   Physical Exam Vitals and nursing note reviewed.  Constitutional:      Appearance: He is well-developed.  HENT:     Head: Atraumatic.  Cardiovascular:     Rate and Rhythm: Normal rate.  Pulmonary:  Effort: Pulmonary effort is normal.  Musculoskeletal:        General: Tenderness present.     Cervical back: Neck supple.     Comments: Patrick Herrera has tenderness with abduction and forward flexion. Intact distal pulses  Skin:    General: Skin is warm.  Neurological:     Mental Status: He is alert and oriented to person, place, and time.     ED Results / Procedures / Treatments   Labs (all labs ordered are listed, but only abnormal results are displayed) Labs Reviewed - No data to display  EKG None  Radiology No results found.  Procedures Procedures (including critical care time)  Medications Ordered in ED Medications  naproxen (NAPROSYN) tablet 500 mg (has no administration in time range)  HYDROcodone-acetaminophen (NORCO/VICODIN) 5-325 MG per tablet 1 tablet (has  no administration in time range)    ED Course  I have reviewed the triage vital signs and the nursing notes.  Pertinent labs & imaging results that were available during my care of the Patrick Herrera were reviewed by me and considered in my medical decision making (see chart for details).    MDM Rules/Calculators/A&P                          Patrick Herrera comes in a chief complaint of shoulder pain.  Based on the exam it does appear that he has tendinopathy.  We will give him follow-up with Dr. Dion Saucier, orthopedic surgery for second opinion.  As far as pain control is concerned will be conservative with NSAIDs and topical medications.  Final Clinical Impression(s) / ED Diagnoses Final diagnoses:  Tendinitis of right rotator cuff    Rx / DC Orders ED Discharge Orders         Ordered    meloxicam (MOBIC) 7.5 MG tablet  Daily     Discontinue  Reprint     11/18/19 0832    acetaminophen (TYLENOL 8 HOUR) 650 MG CR tablet  Every 8 hours PRN     Discontinue  Reprint     11/18/19 9702           Derwood Kaplan, MD 11/18/19 [phone]

## 2019-11-22 ENCOUNTER — Other Ambulatory Visit: Payer: Self-pay | Admitting: Orthopaedic Surgery

## 2019-11-22 DIAGNOSIS — M25511 Pain in right shoulder: Secondary | ICD-10-CM

## 2019-11-29 ENCOUNTER — Ambulatory Visit
Admission: RE | Admit: 2019-11-29 | Discharge: 2019-11-29 | Disposition: A | Discharge status: Home / Self Care | Payer: Medicare HMO | Source: Ambulatory Visit | Attending: Orthopaedic Surgery | Admitting: Orthopaedic Surgery

## 2019-11-29 ENCOUNTER — Other Ambulatory Visit: Payer: Self-pay

## 2019-11-29 DIAGNOSIS — M25511 Pain in right shoulder: Secondary | ICD-10-CM

## 2019-12-11 ENCOUNTER — Telehealth (INDEPENDENT_AMBULATORY_CARE_PROVIDER_SITE_OTHER): Payer: Medicare HMO | Admitting: Primary Care

## 2019-12-11 ENCOUNTER — Other Ambulatory Visit: Payer: Self-pay

## 2019-12-11 ENCOUNTER — Encounter (INDEPENDENT_AMBULATORY_CARE_PROVIDER_SITE_OTHER): Payer: Self-pay | Admitting: Primary Care

## 2019-12-11 DIAGNOSIS — Z09 Encounter for follow-up examination after completed treatment for conditions other than malignant neoplasm: Secondary | ICD-10-CM

## 2019-12-11 DIAGNOSIS — M25511 Pain in right shoulder: Secondary | ICD-10-CM

## 2019-12-11 DIAGNOSIS — Z7689 Persons encountering health services in other specified circumstances: Secondary | ICD-10-CM | POA: Diagnosis not present

## 2019-12-11 NOTE — Progress Notes (Signed)
Virtual Visit via Telephone Note  I connected with Patrick Herrera on 12/11/19 at  3:30 PM EDT by telephone and verified that I am speaking with the correct person using two identifiers.   I discussed the limitations, risks, security and privacy concerns of performing an evaluation and management service by telephone and the availability of in person appointments. I also discussed with the patient that there may be a patient responsible charge related to this service. The patient expressed understanding and agreed to proceed.   History of Present Illness: [name] is a 64 year old male having a tele visit to establish care and needs pain prescription and hospital follow up.Patrick Herrera He has pain in right-sided shoulder tendinopathy. Requiring surgery but ortho per patient will not perform surgery before he is established with a PCP . The PCP provide narcotics for pain. Explained to patient I do not prescribe narcotics the person who performs the surgery will write pain medication for a limited time. States hes been on pain medication 31 years and only went to 2 doctors ones dead and the other is retired. Offer pain management referral  Past Medical History:  Diagnosis Date   Chronic pain    Kidney stones     Current Outpatient Medications on File Prior to Visit  Medication Sig Dispense Refill   acetaminophen (TYLENOL 8 HOUR) 650 MG CR tablet Take 1 tablet (650 mg total) by mouth every 8 (eight) hours as needed for pain or fever. 30 tablet 0   ALPRAZolam (XANAX) 1 MG tablet Take 1 mg by mouth 4 (four) times daily as needed. anxiety     Lidocaine 4 % PTCH Apply 1 patch topically 2 (two) times daily. 12 patch 0   meloxicam (MOBIC) 7.5 MG tablet Take 1 tablet (7.5 mg total) by mouth daily. 10 tablet 0   omeprazole (PRILOSEC) 20 MG capsule Take 1 capsule by mouth daily.     Oxycodone HCl 10 MG TABS Take 1 tablet by mouth 5 (five) times daily as needed for pain.     No current  facility-administered medications on file prior to visit.   Observations/Objective: Review of Systems  Musculoskeletal: Positive for joint pain.       Shoulder pain right    Assessment and Plan:   Follow Up Instructions: Darell was seen today for hospitalization follow-up.  Diagnoses and all orders for this visit:  Encounter to establish care [name], NP-C will be your  (PCP) she is mastered prepared . Able to diagnosed and treatment also  answer health concern as well as continuing care of varied medical conditions, not limited by cause, organ system, or diagnosis.   Pain in joint of right shoulder Impression High-riding humeral head with resulting mild narrowing of the subacromial space and rotator cuff impingement. Moderate infraspinatus muscle fatty atrophy. Refer to orthopedic  Pain management referral   Hospital discharge follow-up Presented to [facility] ER for shoulder pain..recommended to undergo rotator cuff surgery, but has not. Shoulder pain increased  recently and he was requesting pain control and follow-up with another orthopedist for a second opinion in a different approach on surgical repair. Requesting establishment of care and ortho referral    I discussed the assessment and treatment plan with the patient. The patient was provided an opportunity to ask questions and all were answered. The patient agreed with the plan and demonstrated an understanding of the instructions.   The patient was advised to call back or seek an  in-person evaluation if the symptoms worsen or if the condition fails to improve as anticipated.  I provided minutes of 18 non-face-to-face time during this encounter. Review charts, labs and imaging   Grayce Sessions, NP

## 2019-12-11 NOTE — Progress Notes (Signed)
Pt needs referral to [facility] for orthopedic surgery  [address]

## 2019-12-13 ENCOUNTER — Telehealth: Payer: Self-pay | Admitting: General Practice

## 2019-12-13 NOTE — Telephone Encounter (Signed)
Please advise. Patient has an appointment 12/18/2019 @ 3:10pm  Copied from CRM [id number]. Topic: Referral - Request for Referral >> Dec 13, 2019 10:44 AM Wyonia Hough E wrote: Has patient seen PCP for this complaint? Yes  *If NO, is insurance requiring patient see PCP for this issue before PCP can refer them? Referral for which specialty: pain clinic / pain management  Preferred provider/office:  Reason for referral: rotator cuff surgery pain // pt stated he asked for a referral last week/ please advise asap

## 2019-12-17 NOTE — Telephone Encounter (Signed)
Patient calling back about request. Patient was seen by [name] at [facility]. Routing to correct practice. Patient states this was discussed at visit. Please contact patient to advise.

## 2019-12-17 NOTE — Telephone Encounter (Signed)
Contacted patient and informed that the referral has to be sent to a different pain clinic. It was originally sent to [facility] but they have discharged him. He verbalized understanding.

## 2019-12-18 ENCOUNTER — Inpatient Hospital Stay: Payer: Medicare HMO | Admitting: Physician Assistant

## 2020-11-04 NOTE — Progress Notes (Signed)
Date:  11/05/2020   ID:  Patrick Herrera, DOB Sep 15, 1955, MRN [id number]  PCP:  Shanon Rosser, PA-C  Cardiologist: Rex Kras, DO, [facility] (established care 11/05/2020)  REASON FOR CONSULT: Tachycardia  REQUESTING PHYSICIAN:  Shanon Rosser, PA-C [address],  [place] [address]  Chief Complaint  Patient presents with  . Tachycardia    Irregular heartbeat  . New Patient (Initial Visit)  . Pre-op Exam    HPI  Patrick Herrera is a 65 y.o. male who presents to the office with a chief complaint of " irregular heart rate, tachycardia, surgical clearance." Patient's past medical history and cardiovascular risk factors include: Atrial Fibrillation, hypertension, diabetes mellitus type 2, mixed hyperlipidemia, marijuana use, obesity due to excess calories.  He is referred to the office at the request of Long, Nicki Reaper, PA-C for evaluation of tachycardia.  Patient is referred to the office for evaluation of tachycardia and newly discovered atrial fibrillation.  Patient states that he was diagnosed with atrial fibrillation in April 2022 by his primary care provider.  Chronicity is unknown.  Patient has been taking propranolol for rate control and Eliquis for thromboembolic prophylaxis.  He denies any prior history of intracranial or gastrointestinal bleeding.  Patient is requesting a preoperative risk stratification for right shoulder surgery.  He plans to have this done by one of the providers at [facility], surgery is to be determined.  Patient denies any chest pain at rest or with effort related activities.  He does have shortness of breath with effort related activities which improves with resting.  This is been going on for several years.  Patient is currently on metformin but denies the diagnosis of diabetes.  His most recent hemoglobin A1c is 6.4.  No family history of premature coronary disease or sudden cardiac death.  FUNCTIONAL STATUS: No structured exercise  program or daily routine.   ALLERGIES: Allergies  Allergen Reactions  . Fentanyl Nausea And Vomiting  . Oxycodone Nausea And Vomiting  . Penicillins Nausea And Vomiting    Has patient had a PCN reaction causing immediate rash, facial/tongue/throat swelling, SOB or lightheadedness with hypotension: no Has patient had a PCN reaction causing severe rash involving mucus membranes or skin necrosis: no No Has patient had a PCN reaction that required hospitalization: No Has patient had a PCN reaction occurring within the last 10 years: No If all of the above answers are "NO", then may proceed with Cephalosporin use.     MEDICATION LIST PRIOR TO VISIT: Current Meds  Medication Sig  . acetaminophen (TYLENOL 8 HOUR) 650 MG CR tablet Take 1 tablet (650 mg total) by mouth every 8 (eight) hours as needed for pain or fever.  [name] ELIQUIS 5 MG TABS tablet Take 1 tablet by mouth 2 (two) times daily.  [name] esomeprazole (NEXIUM) 40 MG capsule Take 40 mg by mouth daily at 12 noon.  . hydrochlorothiazide (MICROZIDE) 12.5 MG capsule Take 1 capsule (12.5 mg total) by mouth in the morning.  . metFORMIN (GLUCOPHAGE) 500 MG tablet Take 1 tablet by mouth daily.  [name] oxyCODONE-acetaminophen (PERCOCET) 7.5-325 MG tablet Take 1 tablet by mouth every 4 (four) hours as needed for severe pain.  [name] propranolol ER (INDERAL LA) 160 MG SR capsule Take 160 mg by mouth daily.     PAST MEDICAL HISTORY: Past Medical History:  Diagnosis Date  . Chronic pain   . Diabetes mellitus without complication ([place])   . Hyperlipidemia   . Hypertension   .  Kidney stones     PAST SURGICAL HISTORY: Past Surgical History:  Procedure Laterality Date  . bil knee surgery    . ELBOW SURGERY    . NECK SURGERY    . SHOULDER SURGERY      FAMILY HISTORY: The patient family history includes Heart attack in his mother.  SOCIAL HISTORY:  The patient  reports that he has never smoked. He has never used smokeless tobacco. He reports current  alcohol use. He reports current drug use. Drug: Marijuana.  REVIEW OF SYSTEMS: Review of Systems  Constitutional: Negative for chills and fever.  HENT: Negative for hoarse voice and nosebleeds.   Eyes: Negative for discharge, double vision and pain.  Cardiovascular: Positive for dyspnea on exertion. Negative for chest pain, claudication, leg swelling, near-syncope, orthopnea, palpitations, paroxysmal nocturnal dyspnea and syncope.  Respiratory: Positive for shortness of breath. Negative for hemoptysis.   Musculoskeletal: Negative for muscle cramps and myalgias.  Gastrointestinal: Negative for abdominal pain, constipation, diarrhea, hematemesis, hematochezia, melena, nausea and vomiting.  Neurological: Positive for dizziness and light-headedness.    PHYSICAL EXAM: Vitals with BMI 11/05/2020 11/18/2019 11/18/2019  Height 5' 8"  - -  Weight 218 lbs - -  BMI 38.25 - -  Systolic 053 976 734  Diastolic 81 84 91  Pulse 88 106 -    CONSTITUTIONAL: Well-developed and well-nourished. No acute distress.  SKIN: Skin is warm and dry. No rash noted. No cyanosis. No pallor. No jaundice HEAD: Normocephalic and atraumatic.  EYES: No scleral icterus MOUTH/THROAT: Moist oral membranes.  NECK: No JVD present. No thyromegaly noted.  Left carotid bruit LYMPHATIC: No visible cervical adenopathy.  CHEST Normal respiratory effort. No intercostal retractions  LUNGS: Clear to auscultation bilaterally.  No stridor. No wheezes. No rales.  CARDIOVASCULAR: Irregularly irregular, variable S1-S2, no murmurs rubs or gallops appreciated. ABDOMINAL: Obese, soft, nontender, nondistended, positive bowel sounds all 4 quadrants.  No apparent ascites.  EXTREMITIES: Trace bilateral peripheral edema, 2+ dorsalis pedis and posterior tibial pulses. HEMATOLOGIC: No significant bruising NEUROLOGIC: Oriented to person, place, and time. Nonfocal. Normal muscle tone.  PSYCHIATRIC: Normal mood and affect. Normal behavior.  Cooperative  CARDIAC DATABASE: EKG: 11/05/2020: Atrial fibrillation, 71 bpm, normal axis, without underlying ischemia or injury pattern.   Echocardiogram: No results found for this or any previous visit from the past 1095 days.   Stress Testing: No results found for this or any previous visit from the past 1095 days.  Heart Catheterization: None  LABORATORY DATA: CBC Latest Ref Rng & Units 12/02/2016 05/24/2012 08/06/2007  WBC 4.0 - 10.5 K/uL 7.8 9.3 -  Hemoglobin 13.0 - 17.0 g/dL 15.4 18.6(H) 17.3(H)  Hematocrit 39.0 - 52.0 % 43.6 52.1(H) 51.0  Platelets 150 - 400 K/uL 190 219 -    CMP Latest Ref Rng & Units 12/02/2016 05/24/2012 08/06/2007  Glucose 65 - 99 mg/dL 115(H) 143(H) 113(H)  BUN 6 - 20 mg/dL 14 16 19   Creatinine 0.61 - 1.24 mg/dL 0.93 1.14 1.1  Sodium 135 - 145 mmol/L 139 137 138  Potassium 3.5 - 5.1 mmol/L 3.4(L) 4.2 3.4(L)  Chloride 101 - 111 mmol/L 104 100 104  CO2 22 - 32 mmol/L 26 25 -  Calcium 8.9 - 10.3 mg/dL 9.5 10.5 -    Lipid Panel  No results found for: CHOL, TRIG, HDL, CHOLHDL, VLDL, LDLCALC, LDLDIRECT, LABVLDL  No components found for: NTPROBNP No results for input(s): PROBNP in the last 8760 hours. No results for input(s): TSH in the last 8760 hours.  BMP No results for input(s): NA, K, CL, CO2, GLUCOSE, BUN, CREATININE, CALCIUM, GFRNONAA, GFRAA in the last 8760 hours.  HEMOGLOBIN A1C No results found for: HGBA1C, MPG   External Labs:  Date Collected: 08/28/2020 , information obtained by PCP Hemoglobin A1c: 6.4 TSH: 1.47   Date Collected: 08/29/2020 Potassium: 5.5 Creatinine 0.94 mg/dL. eGFR: 86 mL/min per 1.73 m Hemoglobin: 17.1 g/dL and hematocrit: 52.4 % Lipid profile: Total cholesterol 192 , triglycerides 171 , HDL 40 , LDL 123, non HDL 152 AST: 32 , ALT: 42 , alkaline phosphatase: 64   IMPRESSION:    ICD-10-CM   1. Tachycardia  R00.0 EKG 12-Lead    PCV ECHOCARDIOGRAM COMPLETE  2. Persistent atrial fibrillation (HCC)  I48.19 PCV  ECHOCARDIOGRAM COMPLETE    PCV MYOCARDIAL PERFUSION WITH LEXISCAN  3. Long term (current) use of anticoagulants  Z79.01   4. Dizziness and giddiness  R42 PCV CAROTID DUPLEX (BILATERAL)  5. Benign hypertension  I10 hydrochlorothiazide (MICROZIDE) 12.5 MG capsule    Basic metabolic panel    Magnesium  6. Mixed hyperlipidemia  E78.2   7. Marijuana abuse  F12.10   8. Class 1 obesity due to excess calories without serious comorbidity with body mass index (BMI) of 33.0 to 33.9 in adult  E66.09    Z68.33   9. Preop cardiovascular exam  Z01.810 PCV ECHOCARDIOGRAM COMPLETE    PCV MYOCARDIAL PERFUSION WITH LEXISCAN     RECOMMENDATIONS: Patrick Herrera is a 65 y.o. male whose past medical history and cardiac risk factors include: Atrial Fibrillation, hypertension, diabetes mellitus type 2, mixed hyperlipidemia, marijuana use, obesity due to excess calories.  Persistent atrial fibrillation: Chronicity unknown, but discovered April 2022. Rate control: Propranolol. Rhythm control: N/A. Thromboembolic prophylaxis: Eliquis I had a long and detailed discussion with the patient regarding the incidence, etiology, pathophysiology, prognosis, and therapeutic options for atrial fibrillation.  Specifically we discussed oral anticoagulation for stroke prevention. CHA2DS2-VASc SCORE is 2 (HTN, DM) which correlates to 2.2 % risk of stroke per year.   Long-term oral anticoagulation: Patient is already on Eliquis for thromboembolic prophylaxis prior to establishing care with myself. Discussed the risks, benefits, and alternatives to oral anticoagulation.  Benign essential hypertension: Office blood pressures currently not at goal. Start hydrochlorothiazide 12.5 mg p.o. every morning.  Chose HCTZ over ACE inhibitor/ARB secondary to underlying hyperkalemia. Blood work in 1 week to evaluate kidney function and electrolytes. Low-salt diet recommended.  Hyperlipidemia, mixed: Continue statin therapy.  Currently  managed by primary care provider.  Non-insulin-dependent diabetes mellitus type 2: Currently on metformin.  Currently managed by primary care provider.  Patient is requesting preoperative risk stratification for shoulder surgery.  However, given the new diagnosis of atrial fibrillation and other cardiovascular risk factors would recommend an ischemic evaluation prior to risk stratification.  Echocardiogram will be ordered to evaluate for structural heart disease and left ventricular systolic function.  Plan nuclear stress test to evaluate for reversible ischemia.   FINAL MEDICATION LIST END OF ENCOUNTER: Meds ordered this encounter  Medications  . hydrochlorothiazide (MICROZIDE) 12.5 MG capsule    Sig: Take 1 capsule (12.5 mg total) by mouth in the morning.    Dispense:  90 capsule    Refill:  0    Medications Discontinued During This Encounter  Medication Reason  . omeprazole (PRILOSEC) 20 MG capsule Error  . Oxycodone HCl 10 MG TABS Error  . ALPRAZolam (XANAX) 1 MG tablet Error  . Lidocaine 4 % PTCH Error  . meloxicam (MOBIC)  7.5 MG tablet Error     Current Outpatient Medications:  .  acetaminophen (TYLENOL 8 HOUR) 650 MG CR tablet, Take 1 tablet (650 mg total) by mouth every 8 (eight) hours as needed for pain or fever., Disp: 30 tablet, Rfl: 0 .  ELIQUIS 5 MG TABS tablet, Take 1 tablet by mouth 2 (two) times daily., Disp: , Rfl:  .  esomeprazole (NEXIUM) 40 MG capsule, Take 40 mg by mouth daily at 12 noon., Disp: , Rfl:  .  hydrochlorothiazide (MICROZIDE) 12.5 MG capsule, Take 1 capsule (12.5 mg total) by mouth in the morning., Disp: 90 capsule, Rfl: 0 .  metFORMIN (GLUCOPHAGE) 500 MG tablet, Take 1 tablet by mouth daily., Disp: , Rfl:  .  oxyCODONE-acetaminophen (PERCOCET) 7.5-325 MG tablet, Take 1 tablet by mouth every 4 (four) hours as needed for severe pain., Disp: , Rfl:  .  propranolol ER (INDERAL LA) 160 MG SR capsule, Take 160 mg by mouth daily., Disp: , Rfl:   Orders  Placed This Encounter  Procedures  . Basic metabolic panel  . Magnesium  . PCV MYOCARDIAL PERFUSION WITH LEXISCAN  . EKG 12-Lead  . PCV ECHOCARDIOGRAM COMPLETE  . PCV CAROTID DUPLEX (BILATERAL)    There are no Patient Instructions on file for this visit.   --Continue cardiac medications as reconciled in final medication list. --Return in about 4 weeks (around 12/03/2020) for Follow up, A. fib, Review test results. Or sooner if needed. --Continue follow-up with your primary care physician regarding the management of your other chronic comorbid conditions.  Patient's questions and concerns were addressed to his satisfaction. He voices understanding of the instructions provided during this encounter.   This note was created using a voice recognition software as a result there may be grammatical errors inadvertently enclosed that do not reflect the nature of this encounter. Every attempt is made to correct such errors.  Rex Kras, [place], [facility]  Pager: [phone] Office: [phone]

## 2020-11-05 ENCOUNTER — Encounter: Payer: Self-pay | Admitting: Cardiology

## 2020-11-05 ENCOUNTER — Other Ambulatory Visit: Payer: Self-pay

## 2020-11-05 ENCOUNTER — Ambulatory Visit: Payer: Medicare Other | Admitting: Cardiology

## 2020-11-05 VITALS — BP 150/81 | HR 88 | Temp 96.8°F | Resp 16 | Ht 68.0 in | Wt 218.0 lb

## 2020-11-05 DIAGNOSIS — I10 Essential (primary) hypertension: Secondary | ICD-10-CM

## 2020-11-05 DIAGNOSIS — Z7901 Long term (current) use of anticoagulants: Secondary | ICD-10-CM

## 2020-11-05 DIAGNOSIS — E6609 Other obesity due to excess calories: Secondary | ICD-10-CM

## 2020-11-05 DIAGNOSIS — R000 Tachycardia, unspecified: Secondary | ICD-10-CM

## 2020-11-05 DIAGNOSIS — F1210 Cannabis abuse, uncomplicated: Secondary | ICD-10-CM

## 2020-11-05 DIAGNOSIS — Z6833 Body mass index (BMI) 33.0-33.9, adult: Secondary | ICD-10-CM

## 2020-11-05 DIAGNOSIS — I4819 Other persistent atrial fibrillation: Secondary | ICD-10-CM

## 2020-11-05 DIAGNOSIS — E782 Mixed hyperlipidemia: Secondary | ICD-10-CM

## 2020-11-05 DIAGNOSIS — R42 Dizziness and giddiness: Secondary | ICD-10-CM

## 2020-11-05 DIAGNOSIS — Z01810 Encounter for preprocedural cardiovascular examination: Secondary | ICD-10-CM

## 2020-11-05 MED ORDER — HYDROCHLOROTHIAZIDE 12.5 MG PO CAPS: 12.5000 mg | ORAL_CAPSULE | Freq: Every morning | ORAL | 0 refills | Status: DC

## 2020-11-10 ENCOUNTER — Other Ambulatory Visit: Payer: Self-pay

## 2020-11-10 DIAGNOSIS — I10 Essential (primary) hypertension: Secondary | ICD-10-CM

## 2020-11-17 ENCOUNTER — Other Ambulatory Visit: Payer: Medicare Other

## 2020-12-01 ENCOUNTER — Other Ambulatory Visit: Payer: Medicare Other

## 2020-12-01 ENCOUNTER — Telehealth: Payer: Self-pay

## 2020-12-01 NOTE — Telephone Encounter (Signed)
NOTES ON FILE FROM SCOTT LONG PA [phone], SENT REFERRAL TO SCHEDULING

## 2020-12-01 NOTE — Telephone Encounter (Signed)
Called and spoke with patient. He was very frustrated because he states he heard Dr. Odis Hollingshead say during his last visit "you need open heart surgery".  Patient was referred to our office for perioperative cardiovascular risk stratification with new onset atrial fibrillation.  I thoroughly reviewed Dr. Emelda Brothers records and there is no recommendation for procedure or surgery of any kind from a cardiovascular standpoint.  Rather Dr. Odis Hollingshead recommended further cardiac evaluation with nuclear stress test, echocardiogram, and carotid artery duplex prior to scheduling of patient's shoulder surgery.  Patient is frustrated and expresses that he will not be coming back to our office and will not be undergoing any tests as recommended.  Patient's PCP has referred him to a different cardiovascular office for preoperative risk stratification.  I reviewed extensively with patient that Dr. Odis Hollingshead did not recommend cardiac surgery per his note from office visit 11/05/2020.    Rayford Halsted, PA-C 12/01/2020, 12:48 PM Office: [phone]

## 2020-12-02 ENCOUNTER — Other Ambulatory Visit: Payer: Medicare Other

## 2020-12-04 NOTE — Telephone Encounter (Signed)
That's fine he is more than welcome to see a different cardiologist. In the meantime if he needs cardiovascular care / attention I will be more than willing to help answer his questions and concerns until he establishes with another provider.   I DID NOT speak of any "open heart surgery." There must have been confusion regarding this matter.   His primary provider had also called Dietitian Long) and I spoke to him regarding this and conveyed the same that there is no data / symptoms that require Patrick Herrera to need "bypass surgery."  I apologized for the confusion that may have been created but if patient wants to see another provider I am okay and encourage it to make him comfortable. In the meantime, if questions arise he can call the office or speak to either myself or my partner Dr. Jacinto Halim.

## 2020-12-11 ENCOUNTER — Ambulatory Visit: Payer: Medicare Other | Admitting: Cardiology

## 2021-01-14 ENCOUNTER — Ambulatory Visit: Payer: Medicare Other | Admitting: Internal Medicine

## 2021-03-29 ENCOUNTER — Ambulatory Visit (INDEPENDENT_AMBULATORY_CARE_PROVIDER_SITE_OTHER): Payer: Medicare Other | Admitting: Internal Medicine

## 2021-03-29 ENCOUNTER — Telehealth: Payer: Self-pay | Admitting: Internal Medicine

## 2021-03-29 ENCOUNTER — Encounter: Payer: Self-pay | Admitting: *Deleted

## 2021-03-29 ENCOUNTER — Ambulatory Visit (INDEPENDENT_AMBULATORY_CARE_PROVIDER_SITE_OTHER): Payer: Medicare Other

## 2021-03-29 ENCOUNTER — Encounter: Payer: Self-pay | Admitting: Internal Medicine

## 2021-03-29 ENCOUNTER — Other Ambulatory Visit: Payer: Self-pay

## 2021-03-29 VITALS — BP 144/88 | HR 90 | Ht 68.0 in | Wt 210.0 lb

## 2021-03-29 DIAGNOSIS — I4891 Unspecified atrial fibrillation: Secondary | ICD-10-CM

## 2021-03-29 DIAGNOSIS — R079 Chest pain, unspecified: Secondary | ICD-10-CM

## 2021-03-29 DIAGNOSIS — Z01818 Encounter for other preprocedural examination: Secondary | ICD-10-CM

## 2021-03-29 DIAGNOSIS — I10 Essential (primary) hypertension: Secondary | ICD-10-CM

## 2021-03-29 MED ORDER — HYDROCHLOROTHIAZIDE 12.5 MG PO CAPS: 12.5000 mg | ORAL_CAPSULE | Freq: Every morning | ORAL | 3 refills | Status: DC

## 2021-03-29 MED ORDER — ELIQUIS 5 MG PO TABS: 5.0000 mg | ORAL_TABLET | Freq: Two times a day (BID) | ORAL | 11 refills | Status: DC

## 2021-03-29 MED ORDER — PROPRANOLOL HCL ER 160 MG PO CP24: 160.0000 mg | ORAL_CAPSULE | Freq: Every day | ORAL | 3 refills | Status: DC

## 2021-03-29 NOTE — Patient Instructions (Signed)
Medication Instructions:  Your physician recommends that you continue on your current medications as directed. Please refer to the Current Medication list given to you today.  *If you need a refill on your cardiac medications before your next appointment, please call your pharmacy*   Lab Work: NONE   If you have labs (blood work) drawn today and your tests are completely normal, you will receive your results only by: MyChart Message (if you have MyChart) OR A paper copy in the mail If you have any lab test that is abnormal or we need to change your treatment, we will call you to review the results.   Testing/Procedures: Your physician has requested that you have an echocardiogram. Echocardiography is a painless test that uses sound waves to create images of your heart. It provides your doctor with information about the size and shape of your heart and how well your heart's chambers and valves are working. This procedure takes approximately one hour. There are no restrictions for this procedure.  Your physician has requested that you have a lexiscan myoview. For further information please visit [email or web address] Please follow instruction sheet, as given.    Follow-Up: At [facility], you and your health needs are our priority.  As part of our continuing mission to provide you with exceptional heart care, we have created designated Provider Care Teams.  These Care Teams include your primary Cardiologist (physician) and Advanced Practice Providers (APPs -  Physician Assistants and Nurse Practitioners) who all work together to provide you with the care you need, when you need it.  We recommend signing up for the patient portal called "MyChart".  Sign up information is provided on this After Visit Summary.  MyChart is used to connect with patients for Virtual Visits (Telemedicine).  Patients are able to view lab/test results, encounter notes, upcoming appointments, etc.  Non-urgent messages  can be sent to your provider as well.   To learn more about what you can do with MyChart, go to [email or web address].    Your next appointment:   6 week(s)  The format for your next appointment:   In Person  Provider:   Dietrich Pates, MD, Randall An, PA-C, or [name], PA-C   Other Instructions Thank you for choosing [facility]!

## 2021-03-29 NOTE — Telephone Encounter (Signed)
Checking percert on the following patient for testing scheduled at [facility].     LEXISCAN -04/06/2021  ECHO   PATIENT IS WEARING A 2 DAY ZIO

## 2021-03-29 NOTE — Progress Notes (Signed)
Cardiology Office Note   Date:  03/29/2021   ID:  UNIQUE SEARFOSS, DOB 12-22-1955, MRN [id number]  PCP:  Beryle Beams, MD  Cardiologist:   Dietrich Pates, MD   Patient presents for follow up of atrial fibrillation and HTN     History of Present Illness: Patrick Herrera is a 65 y.o. male  who was previously followed at [facility] in [place] in June 2022 by Dr Odis Hollingshead.  Seen for irregular heart beat, tachycardia and presurgical clearance    PMH significant for atrial fibrillation, HTN, Type II DM, HL, obesity.   The pt was diagnosed with atrial fibrillatoin in APril 2022   Placed on propranolol and Eliquis  Echo and stress toest ordered     Also, with BP elevatoin, patient was started on HCTZ 12.5 mg     Neither echo or sterss test were done   The pt denies palpitaitons   He does say he gets occasional chest tightness but it is not associated with activity    Denis dizziness           Current Meds  Medication Sig   acetaminophen (TYLENOL 8 HOUR) 650 MG CR tablet Take 1 tablet (650 mg total) by mouth every 8 (eight) hours as needed for pain or fever.   ELIQUIS 5 MG TABS tablet Take 1 tablet by mouth 2 (two) times daily.   esomeprazole (NEXIUM) 40 MG capsule Take 40 mg by mouth daily at 12 noon.   hydrochlorothiazide (MICROZIDE) 12.5 MG capsule Take 1 capsule (12.5 mg total) by mouth in the morning.   metFORMIN (GLUCOPHAGE) 500 MG tablet Take 1 tablet by mouth daily.   oxyCODONE-acetaminophen (PERCOCET) 7.5-325 MG tablet Take 1 tablet by mouth every 4 (four) hours as needed for severe pain.   propranolol ER (INDERAL LA) 160 MG SR capsule Take 160 mg by mouth daily.     Allergies:   Fentanyl, Oxycodone, and Penicillins   Past Medical History:  Diagnosis Date   Chronic pain    Diabetes mellitus without complication (HCC)    Hyperlipidemia    Hypertension    Kidney stones     Past Surgical History:  Procedure Laterality Date   bil knee surgery     ELBOW SURGERY      NECK SURGERY     SHOULDER SURGERY       Social History:  The patient  reports that he has never smoked. He has never used smokeless tobacco. He reports current alcohol use. He reports current drug use. Drug: Marijuana.   Family History:  The patient's family history includes Heart attack in his mother.    ROS:  Please see the history of present illness. All other systems are reviewed and  Negative to the above problem except as noted.    PHYSICAL EXAM: VS:  BP (!) 144/88   Pulse 90   Ht 5\' 8"  (1.727 m)   Wt 210 lb (95.3 kg)   BMI 31.93 kg/m   GEN: Well nourished, well developed, in no acute distress  HEENT: normal  Neck: no JVD, carotid bruits, or masses Cardiac:Irreg irreg   No murmurs   NO LE  edema  Respiratory:  clear to auscultation bilaterally, normal work of breathing GI: soft, nontender, nondistended, + BS  No hepatomegaly  MS: no deformity Moving all extremities   Skin: warm and dry, no rash Neuro:  Strength and sensation are intact Psych: euthymic mood, full affect   EKG:  EKG is not ordered today.   Lipid Panel No results found for: CHOL, TRIG, HDL, CHOLHDL, VLDL, LDLCALC, LDLDIRECT    Wt Readings from Last 3 Encounters:  03/29/21 210 lb (95.3 kg)  11/05/20 218 lb (98.9 kg)  11/18/19 210 lb (95.3 kg)      ASSESSMENT AND PLAN:  1  Atrial fibrillation    Will set up for an echo to eval LVEF as well as chaber sizzes.    Also set up for Zio patch to eval overall HR control Keep on same meds   2 Cardiac risk stratiification.  Pt with Hx of DM   His symtpoms are atypcia for ischemia but given hx I would recomm a Lexiscan myoview to r/o inducible ischemia  E  HTN  BP is high   Neds to be followed at home   I would not make any changes now  4   HL   Needs to get lipids from PCP   He should be on a statin   5  DM   Discussed diet.   Limit carbs  Time restricted eating     Current medicines are reviewed at length with the patient today.  The patient  does not have concerns regarding medicines.  Signed, Dietrich Pates, MD  03/29/2021 2:13 PM    [facility] [address] [place], [place], [place]  [address] Phone: [phone]; Fax: [phone]

## 2021-03-30 ENCOUNTER — Other Ambulatory Visit: Payer: Self-pay

## 2021-03-30 ENCOUNTER — Emergency Department (HOSPITAL_COMMUNITY): Payer: Medicare Other

## 2021-03-30 ENCOUNTER — Encounter (HOSPITAL_COMMUNITY): Payer: Self-pay

## 2021-03-30 ENCOUNTER — Emergency Department (HOSPITAL_COMMUNITY)
Admission: EM | Admit: 2021-03-30 | Discharge: 2021-03-31 | Disposition: A | Discharge status: Home / Self Care | Payer: Medicare Other | Attending: Emergency Medicine | Admitting: Emergency Medicine

## 2021-03-30 DIAGNOSIS — E1169 Type 2 diabetes mellitus with other specified complication: Secondary | ICD-10-CM | POA: Insufficient documentation

## 2021-03-30 DIAGNOSIS — R0789 Other chest pain: Secondary | ICD-10-CM | POA: Insufficient documentation

## 2021-03-30 DIAGNOSIS — I10 Essential (primary) hypertension: Secondary | ICD-10-CM | POA: Diagnosis not present

## 2021-03-30 DIAGNOSIS — R42 Dizziness and giddiness: Secondary | ICD-10-CM | POA: Insufficient documentation

## 2021-03-30 DIAGNOSIS — R0602 Shortness of breath: Secondary | ICD-10-CM | POA: Insufficient documentation

## 2021-03-30 DIAGNOSIS — R079 Chest pain, unspecified: Secondary | ICD-10-CM

## 2021-03-30 DIAGNOSIS — E785 Hyperlipidemia, unspecified: Secondary | ICD-10-CM | POA: Diagnosis not present

## 2021-03-30 HISTORY — DX: Unspecified atrial fibrillation: I48.91

## 2021-03-30 LAB — BASIC METABOLIC PANEL
Anion gap: 11 (ref 5–15)
BUN: 19 mg/dL (ref 8–23)
CO2: 26 mmol/L (ref 22–32)
Calcium: 9.3 mg/dL (ref 8.9–10.3)
Chloride: 99 mmol/L (ref 98–111)
Creatinine, Ser: 0.91 mg/dL (ref 0.61–1.24)
GFR, Estimated: >60 mL/min (ref >60)
Glucose, Bld: 97 mg/dL (ref 70–99)
Potassium: 3.8 mmol/L (ref 3.5–5.1)
Sodium: 136 mmol/L (ref 135–145)

## 2021-03-30 LAB — CBC
HCT: 46 % (ref 39.0–52.0)
Hemoglobin: 15.6 g/dL (ref 13.0–17.0)
MCH: 29.3 pg (ref 26.0–34.0)
MCHC: 33.9 g/dL (ref 30.0–36.0)
MCV: 86.5 fL (ref 80.0–100.0)
Platelets: 254 10*3/uL (ref 150–400)
RBC: 5.32 MIL/uL (ref 4.22–5.81)
RDW: 13.1 % (ref 11.5–15.5)
WBC: 8 10*3/uL (ref 4.0–10.5)
nRBC: 0 % (ref 0.0–0.2)

## 2021-03-30 LAB — TROPONIN I (HIGH SENSITIVITY)
Troponin I (High Sensitivity): 3 ng/L (ref <18)
Troponin I (High Sensitivity): 3 ng/L (ref <18)

## 2021-03-30 MED ORDER — OXYCODONE HCL 5 MG PO TABS
5.0000 mg | ORAL_TABLET | Freq: Once | ORAL | Status: AC
  Administered 2021-03-30: 5 mg via ORAL
  Filled 2021-03-30: qty 1

## 2021-03-30 MED ORDER — ASPIRIN 81 MG PO CHEW
324.0000 mg | CHEWABLE_TABLET | Freq: Once | ORAL | Status: AC
  Administered 2021-03-30: 324 mg via ORAL
  Filled 2021-03-30: qty 4

## 2021-03-30 NOTE — ED Provider Notes (Signed)
AP-EMERGENCY DEPT [facility] Emergency Department Provider Note MRN:  [id number]  Arrival date & time: 03/31/21     Chief Complaint   Chest Pain   History of Present Illness   Patrick Herrera is a 65 y.o. year-old male with a history of A. fib, diabetes presenting to the ED with chief complaint of chest pain.  Location: Left chest with radiation down the left arm Duration: Few hours Onset: Sudden Timing: Constant Description: Sharp Severity: Moderate to severe Exacerbating/Alleviating Factors: None Associated Symptoms: Lightheadedness, shortness of breath Pertinent Negatives: No vomiting, no leg pain or swelling, no abdominal pain  Additional History: Had a cardiac monitor placed yesterday, needs open heart surgery.  Review of Systems  A complete 10 system review of systems was obtained and all systems are negative except as noted in the HPI and PMH.   Patient's Health History    Past Medical History:  Diagnosis Date   Atrial fibrillation (HCC)    Chronic pain    Diabetes mellitus without complication (HCC)    Hyperlipidemia    Hypertension    Kidney stones     Past Surgical History:  Procedure Laterality Date   bil knee surgery     ELBOW SURGERY     NECK SURGERY     SHOULDER SURGERY      Family History  Problem Relation Age of Onset   Heart attack Mother     Social History   Socioeconomic History   Marital status: Divorced    Spouse name: Not on file   Number of children: Not on file   Years of education: Not on file   Highest education level: Not on file  Occupational History   Not on file  Tobacco Use   Smoking status: Never   Smokeless tobacco: Never  Vaping Use   Vaping Use: Never used  Substance and Sexual Activity   Alcohol use: Yes    Comment: social    Drug use: Yes    Types: Marijuana   Sexual activity: Not on file  Other Topics Concern   Not on file  Social History Narrative   Not on file   Social Determinants of Health    Financial Resource Strain: Not on file  Food Insecurity: Not on file  Transportation Needs: Not on file  Physical Activity: Not on file  Stress: Not on file  Social Connections: Not on file  Intimate Partner Violence: Not on file     Physical Exam   Vitals:   03/30/21 2300 03/30/21 2332  BP: (!) 157/96 119/80  Pulse: 90 73  Resp: 18 18  Temp:    SpO2: 97% 99%    CONSTITUTIONAL: Well-appearing, NAD NEURO:  Alert and oriented x 3, no focal deficits EYES:  eyes equal and reactive ENT/NECK:  no LAD, no JVD CARDIO: Regular rate, well-perfused, normal S1 and S2 PULM:  CTAB no wheezing or rhonchi GI/GU:  normal bowel sounds, non-distended, non-tender MSK/SPINE:  No gross deformities, no edema SKIN:  no rash, atraumatic PSYCH:  Appropriate speech and behavior  *Additional and/or pertinent findings included in MDM below  Diagnostic and Interventional Summary    EKG Interpretation  Date/Time:  Tuesday March 30 2021 20:29:54 EDT Ventricular Rate:  83 PR Interval:    QRS Duration: 70 QT Interval:  354 QTC Calculation: 415 R Axis:   69 Text Interpretation: Atrial fibrillation Low voltage QRS Abnormal ECG Confirmed by Bethann Berkshire [phone]) on 03/30/2021 10:34:46 PM  Labs Reviewed  BASIC METABOLIC PANEL  CBC  TROPONIN I (HIGH SENSITIVITY)  TROPONIN I (HIGH SENSITIVITY)    DG Chest 2 View  Final Result      Medications  aspirin chewable tablet 324 mg (324 mg Oral Given 03/30/21 2329)  oxyCODONE (Oxy IR/ROXICODONE) immediate release tablet 5 mg (5 mg Oral Given 03/30/21 2329)     Procedures  /  Critical Care Procedures  ED Course and Medical Decision Making  I have reviewed the triage vital signs, the nursing notes, and pertinent available records from the EMR.  Listed above are laboratory and imaging tests that I personally ordered, reviewed, and interpreted and then considered in my medical decision making (see below for details).  Chest pain,  considering ACS, doubt PE, doubt dissection.  Could be symptoms related to A. fib.  EKG reassuring, first troponin negative, awaiting second troponin.     Second troponin is negative.  Patient's pain is resolved after medications listed above.  We discussed management options at this point, admission versus discharge.  Patient has close follow-up for stress testing in a few days, he is comfortable with going home and coming back if symptoms return or worsen.  Elmer Sow. Pilar Plate, MD [facility] Emergency Medicine [facility] [email or web address]  Final Clinical Impressions(s) / ED Diagnoses     ICD-10-CM   1. Chest pain, unspecified type  R07.9       ED Discharge Orders     None        Discharge Instructions Discussed with and Provided to Patient:     Discharge Instructions      You were evaluated in the Emergency Department and after careful evaluation, we did not find any emergent condition requiring admission or further testing in the hospital.  Your exam/testing today is overall reassuring.  Keep your follow-up appointments with your cardiologist.  Please return to the Emergency Department if you experience any worsening of your condition.   Thank you for allowing [place] to be a part of your care.        Sabas Sous, MD 03/31/21 [phone]

## 2021-03-30 NOTE — ED Triage Notes (Signed)
Pt reports chest pain that started 2 months ago, pain is intermittent, pt has seen (Dr Tenny Craw) and scheduled for stress test on Nov 1st and is being scheduled for open heart surgery. Pt had heart monitor placed yesterday.

## 2021-03-31 NOTE — Discharge Instructions (Signed)
You were evaluated in the Emergency Department and after careful evaluation, we did not find any emergent condition requiring admission or further testing in the hospital.  Your exam/testing today is overall reassuring.  Keep your follow-up appointments with your cardiologist.  Please return to the Emergency Department if you experience any worsening of your condition.   Thank you for allowing [place] to be a part of your care.

## 2021-04-06 ENCOUNTER — Other Ambulatory Visit: Payer: Self-pay

## 2021-04-06 ENCOUNTER — Encounter (HOSPITAL_COMMUNITY): Payer: Self-pay

## 2021-04-06 ENCOUNTER — Ambulatory Visit (HOSPITAL_BASED_OUTPATIENT_CLINIC_OR_DEPARTMENT_OTHER)
Admission: RE | Admit: 2021-04-06 | Discharge: 2021-04-06 | Disposition: A | Discharge status: Home / Self Care | Payer: Medicare Other | Source: Ambulatory Visit | Attending: Internal Medicine | Admitting: Internal Medicine

## 2021-04-06 ENCOUNTER — Ambulatory Visit (HOSPITAL_COMMUNITY)
Admission: RE | Admit: 2021-04-06 | Discharge: 2021-04-06 | Disposition: A | Discharge status: Home / Self Care | Payer: Medicare Other | Source: Ambulatory Visit | Attending: Internal Medicine | Admitting: Internal Medicine

## 2021-04-06 DIAGNOSIS — I340 Nonrheumatic mitral (valve) insufficiency: Secondary | ICD-10-CM | POA: Insufficient documentation

## 2021-04-06 DIAGNOSIS — E785 Hyperlipidemia, unspecified: Secondary | ICD-10-CM | POA: Insufficient documentation

## 2021-04-06 DIAGNOSIS — Z01818 Encounter for other preprocedural examination: Secondary | ICD-10-CM | POA: Diagnosis present

## 2021-04-06 DIAGNOSIS — E119 Type 2 diabetes mellitus without complications: Secondary | ICD-10-CM | POA: Diagnosis not present

## 2021-04-06 DIAGNOSIS — R079 Chest pain, unspecified: Secondary | ICD-10-CM | POA: Insufficient documentation

## 2021-04-06 DIAGNOSIS — I10 Essential (primary) hypertension: Secondary | ICD-10-CM | POA: Diagnosis not present

## 2021-04-06 DIAGNOSIS — Z01810 Encounter for preprocedural cardiovascular examination: Secondary | ICD-10-CM | POA: Diagnosis not present

## 2021-04-06 LAB — NM MYOCAR MULTI W/SPECT W/WALL MOTION / EF
LV dias vol: 53 mL (ref 62–150)
LV sys vol: 16 mL
Nuc Stress EF: 69 %
Peak HR: 151 {beats}/min
RATE: 0.4
Rest HR: 86 {beats}/min
Rest Nuclear Isotope Dose: 11 mCi
SDS: 4
SRS: 5
SSS: 9
ST Depression (mm): 0 mm
Stress Nuclear Isotope Dose: 30 mCi
TID: 1.01

## 2021-04-06 LAB — ECHOCARDIOGRAM COMPLETE
Area-P 1/2: 3.88 cm2
S' Lateral: 2.5 cm

## 2021-04-06 MED ORDER — TECHNETIUM TC 99M TETROFOSMIN IV KIT
10.0000 | PACK | Freq: Once | INTRAVENOUS | Status: AC | PRN
  Administered 2021-04-06: 11 via INTRAVENOUS

## 2021-04-06 MED ORDER — SODIUM CHLORIDE FLUSH 0.9 % IV SOLN
INTRAVENOUS | Status: AC
  Administered 2021-04-06: 10 mL via INTRAVENOUS
  Filled 2021-04-06: qty 10

## 2021-04-06 MED ORDER — TECHNETIUM TC 99M TETROFOSMIN IV KIT
30.0000 | PACK | Freq: Once | INTRAVENOUS | Status: AC | PRN
  Administered 2021-04-06: 30 via INTRAVENOUS

## 2021-04-06 MED ORDER — REGADENOSON 0.4 MG/5ML IV SOLN
INTRAVENOUS | Status: AC
  Administered 2021-04-06: 0.4 mg via INTRAVENOUS
  Filled 2021-04-06: qty 5

## 2021-04-06 NOTE — Progress Notes (Signed)
*  PRELIMINARY RESULTS* Echocardiogram 2D Echocardiogram has been performed.  [name] 04/06/2021, 12:03 PM

## 2021-04-09 ENCOUNTER — Telehealth: Payer: Self-pay

## 2021-04-09 DIAGNOSIS — I4819 Other persistent atrial fibrillation: Secondary | ICD-10-CM

## 2021-04-09 NOTE — Telephone Encounter (Signed)
-----   Message from Dietrich Pates V, MD sent at 04/08/2021  4:59 AM EDT ----- Myoview is a low risk study  No significant ischemia    Sublte changes inferiorly may reflect soft tissue attenuation   No significant ischemia With DM he should be on statin   Needs lipids checked

## 2021-04-09 NOTE — Telephone Encounter (Signed)
Patient states that he was started on crestor 10 mg 2 days ago by his pcp. He will defer f/u blood work to them.Results of stess test and echo discussed.

## 2021-05-03 NOTE — Progress Notes (Addendum)
Cardiology Office Note    Date:  05/10/2021   ID:  Patrick Herrera, DOB [birth year], MRN [id number]   PCP:  Phillips Odor, [place]  Cardiologist:  Dorris Carnes, MD   Advanced Practice Provider:  No care team member to display Electrophysiologist:  None   [phone]   Chief Complaint  Patient presents with   Pre-op Exam     History of Present Illness:  Patrick Herrera is a 65 y.o. male with history of permanent atrial fibrillation, hypertension, HLD, DM2  Patient last saw Dr. Harrington Challenger 03/29/2021 at which time she ordered stress test for chest pain that was low risk no ischemia, 2D echo with normal LVEF and Holter monitor showed A. fib was permanent rate controlled.  Patient comes in for f/u. Says he needs shoulder surgery. Dr. [name]. He's thrilled with cardiac test results. Walks 1 mile regularly. No cardiac symptoms.  Past Medical History:  Diagnosis Date   Atrial fibrillation ([place])    Chronic pain    Diabetes mellitus without complication ([place])    Hyperlipidemia    Hypertension    Kidney stones     Past Surgical History:  Procedure Laterality Date   bil knee surgery     ELBOW SURGERY     NECK SURGERY     SHOULDER SURGERY      Current Medications: Current Meds  Medication Sig   acetaminophen (TYLENOL 8 HOUR) 650 MG CR tablet Take 1 tablet (650 mg total) by mouth every 8 (eight) hours as needed for pain or fever.   ELIQUIS 5 MG TABS tablet Take 1 tablet (5 mg total) by mouth 2 (two) times daily.   esomeprazole (NEXIUM) 40 MG capsule Take 40 mg by mouth daily at 12 noon.   rosuvastatin (CRESTOR) 10 MG tablet Take 10 mg by mouth daily.     Allergies:   Fentanyl, Oxycodone, and Penicillins   Social History   Socioeconomic History   Marital status: Divorced    Spouse name: Not on file   Number of children: Not on file   Years of education: Not on file   Highest education level: Not on file  Occupational History    Not on file  Tobacco Use   Smoking status: Never   Smokeless tobacco: Never  Vaping Use   Vaping Use: Never used  Substance and Sexual Activity   Alcohol use: Yes    Comment: social    Drug use: Yes    Types: Marijuana   Sexual activity: Not on file  Other Topics Concern   Not on file  Social History Narrative   Not on file   Social Determinants of Health   Financial Resource Strain: Not on file  Food Insecurity: Not on file  Transportation Needs: Not on file  Physical Activity: Not on file  Stress: Not on file  Social Connections: Not on file     Family History:  The patient's  family history includes Heart attack in his mother.   ROS:   Please see the history of present illness.    ROS All other systems reviewed and are negative.   PHYSICAL EXAM:   VS:  BP 128/78   Pulse 77   Ht 5\' 8"  (1.727 m)   Wt 212 lb 9.6 oz (96.4 kg)   SpO2 98%   BMI 32.33 kg/m   Physical Exam  GEN: Obese, in no acute distress  Neck: no JVD,  carotid bruits, or masses Cardiac:RRR; no murmurs, rubs, or gallops  Respiratory:  clear to auscultation bilaterally, normal work of breathing GI: soft, nontender, nondistended, + BS Ext: without cyanosis, clubbing, or edema, Good distal pulses bilaterally Neuro:  Alert and Oriented x 3,  Psych: euthymic mood, full affect  Wt Readings from Last 3 Encounters:  05/10/21 212 lb 9.6 oz (96.4 kg)  03/30/21 209 lb 14.1 oz (95.2 kg)  03/29/21 210 lb (95.3 kg)      Studies/Labs Reviewed:   EKG:  EKG is not ordered today.     Recent Labs: 03/30/2021: BUN 19; Creatinine, Ser 0.91; Hemoglobin 15.6; Platelets 254; Potassium 3.8; Sodium 136   Lipid Panel No results found for: CHOL, TRIG, HDL, CHOLHDL, VLDL, LDLCALC, LDLDIRECT  Additional studies/ records that were reviewed today include:  Holter monitor 04/06/2021   Patch Wear Time:  2 days and 0 hours (2022-10-24T14:46:45-0400 to 2022-10-26T15:06:59-0400)   Atrial Fibrillation occurred  continuously (100% burden), ranging from 48-175 bpm (avg of 77 bpm). No Isolated VEs, VE Couplets, or VE Triplets were present.   2D echo 04/06/2021 IMPRESSIONS     1. Left ventricular ejection fraction, by estimation, is 65 to 70%. The  left ventricle has normal function. The left ventricle has no regional  wall motion abnormalities. Left ventricular diastolic parameters are  indeterminate.   2. Right ventricular systolic function is normal. The right ventricular  size is normal. Tricuspid regurgitation signal is inadequate for assessing  PA pressure.   3. Left atrial size was upper normal.   4. Right atrial size was upper normal.   5. The mitral valve is grossly normal. Mild mitral valve regurgitation.   6. The aortic valve is tricuspid. Aortic valve regurgitation is not  visualized.   7. The inferior vena cava is normal in size with greater than 50%  respiratory variability, suggesting right atrial pressure of 3 mmHg.   Comparison(s): No prior Echocardiogram.   NST 04/06/2021   Findings are equivocal. The study is low risk.   No ST deviation was noted. Atrial fibrillation present throughout with heart rate increasing to the 140s following Lexiscan. The ECG was negative for ischemia.   LV perfusion is equivocal.  Small, mild intensity, partially reversible apical inferolateral defect with evidence of diaphragmatic attenuation as well.  Cannot exclude mild ischemic territory in this distribution   Left ventricular function is normal. Nuclear stress EF: 69 %.    Risk Assessment/Calculations:   CHA2DS2-VASc Score = 2  The patient's score is based upon: CHF History: 0 HTN History: 1 Diabetes History: 1 Stroke History: 0 Vascular Disease History: 0 Age Score: 0 Gender Score: 0          Signed,  Ermalinda Barrios, PA-C    05/10/2021 1:24 PM      ASSESSMENT:    1. Chest pain of uncertain etiology   2. Persistent atrial fibrillation ([place])   3. Essential hypertension   4.  Hyperlipidemia, unspecified hyperlipidemia type   5. Class 1 obesity due to excess calories without serious comorbidity with body mass index (BMI) of 33.0 to 33.9 in adult      PLAN:  In order of problems listed above:  Preop clearance for shoulder surgery Dr. Windell Norfolk not determined. Patient with permanent Afib-controlled on monitor.low risk myoview and echo normal LVEF. No need for further cardiac work up. Will call when scheduled so pharmacy can advise on Eliquis.  According to the Revised Cardiac Risk Index (RCRI), his Perioperative Risk of Major  Cardiac Event is (%): 0.4  His Functional Capacity in METs is: 7.99 according to the Duke Activity Status Index (DASI).   Chest pain with low risk Myoview, no ischemia-no further symptoms  Permanent AF on Eliquis and propanolol-Holter monitor 04/2021 controlled heart rate A. fib 100% of the time.  Hypertension well controlled  HLD on Crestor LDL 128 09/2020- needs to be less than 90  DM2 managed by PCP  Obesity-diet and exercise recommended.  Shared Decision Making/Informed Consent        Medication Adjustments/Labs and Tests Ordered: Current medicines are reviewed at length with the patient today.  Concerns regarding medicines are outlined above.  Medication changes, Labs and Tests ordered today are listed in the Patient Instructions below. Patient Instructions  Medication Instructions:  Your physician recommends that you continue on your current medications as directed. Please refer to the Current Medication list given to you today.  *If you need a refill on your cardiac medications before your next appointment, please call your pharmacy*   Lab Work: None If you have labs (blood work) drawn today and your tests are completely normal, you will receive your results only by: MyChart Message (if you have MyChart) OR A paper copy in the mail If you have any lab test that is abnormal or we need to change your treatment, we will  call you to review the results.   Testing/Procedures: None   Follow-Up: At [facility], you and your health needs are our priority.  As part of our continuing mission to provide you with exceptional heart care, we have created designated Provider Care Teams.  These Care Teams include your primary Cardiologist (physician) and Advanced Practice Providers (APPs -  Physician Assistants and Nurse Practitioners) who all work together to provide you with the care you need, when you need it.  We recommend signing up for the patient portal called "MyChart".  Sign up information is provided on this After Visit Summary.  MyChart is used to connect with patients for Virtual Visits (Telemedicine).  Patients are able to view lab/test results, encounter notes, upcoming appointments, etc.  Non-urgent messages can be sent to your provider as well.   To learn more about what you can do with MyChart, go to [email or web address].    Your next appointment:   6 month(s)  The format for your next appointment:   In Person  Provider:   Dietrich Pates, MD    Other Instructions Your provider would like you to get 150 minutes of exercise per week.    Signed, [name], PA-C  05/10/2021 1:24 PM    [facility] [address] [place], [place], [place]  [address] Phone: [phone]; Fax: [phone]

## 2021-05-10 ENCOUNTER — Ambulatory Visit (INDEPENDENT_AMBULATORY_CARE_PROVIDER_SITE_OTHER): Payer: Medicare Other | Admitting: Physician Assistant

## 2021-05-10 ENCOUNTER — Other Ambulatory Visit: Payer: Self-pay

## 2021-05-10 ENCOUNTER — Encounter: Payer: Self-pay | Admitting: Physician Assistant

## 2021-05-10 VITALS — BP 128/78 | HR 77 | Ht 68.0 in | Wt 212.6 lb

## 2021-05-10 DIAGNOSIS — I10 Essential (primary) hypertension: Secondary | ICD-10-CM | POA: Diagnosis not present

## 2021-05-10 DIAGNOSIS — R079 Chest pain, unspecified: Secondary | ICD-10-CM

## 2021-05-10 DIAGNOSIS — Z6833 Body mass index (BMI) 33.0-33.9, adult: Secondary | ICD-10-CM

## 2021-05-10 DIAGNOSIS — E785 Hyperlipidemia, unspecified: Secondary | ICD-10-CM

## 2021-05-10 DIAGNOSIS — E6609 Other obesity due to excess calories: Secondary | ICD-10-CM

## 2021-05-10 DIAGNOSIS — I4819 Other persistent atrial fibrillation: Secondary | ICD-10-CM | POA: Diagnosis not present

## 2021-05-10 NOTE — Patient Instructions (Signed)
Medication Instructions:  Your physician recommends that you continue on your current medications as directed. Please refer to the Current Medication list given to you today.  *If you need a refill on your cardiac medications before your next appointment, please call your pharmacy*   Lab Work: None If you have labs (blood work) drawn today and your tests are completely normal, you will receive your results only by: MyChart Message (if you have MyChart) OR A paper copy in the mail If you have any lab test that is abnormal or we need to change your treatment, we will call you to review the results.   Testing/Procedures: None   Follow-Up: At [facility], you and your health needs are our priority.  As part of our continuing mission to provide you with exceptional heart care, we have created designated Provider Care Teams.  These Care Teams include your primary Cardiologist (physician) and Advanced Practice Providers (APPs -  Physician Assistants and Nurse Practitioners) who all work together to provide you with the care you need, when you need it.  We recommend signing up for the patient portal called "MyChart".  Sign up information is provided on this After Visit Summary.  MyChart is used to connect with patients for Virtual Visits (Telemedicine).  Patients are able to view lab/test results, encounter notes, upcoming appointments, etc.  Non-urgent messages can be sent to your provider as well.   To learn more about what you can do with MyChart, go to [email or web address].    Your next appointment:   6 month(s)  The format for your next appointment:   In Person  Provider:   Dietrich Pates, MD    Other Instructions Your provider would like you to get 150 minutes of exercise per week.

## 2021-06-18 ENCOUNTER — Telehealth: Payer: Self-pay | Admitting: *Deleted

## 2021-06-18 NOTE — Telephone Encounter (Signed)
° °  Pre-operative Risk Assessment    Patient Name: Patrick Herrera  DOB: 1955/11/27 MRN: [id number]      Request for Surgical Clearance    Procedure:   RIGHT REVERSE TOTAL SHOULDER REPLACEMENT  Date of Surgery:  Clearance TBD                                 Surgeon:  DR. [facility] Surgeon's Group or Practice Name:   [facility] Phone number:   [phone] Fax number:   [phone] ATTN:  SHERRI   Type of Clearance Requested:   - Medical  - Pharmacy:  Hold Apixaban (Eliquis) not indicated   Type of Anesthesia:   GENERAL AND INTERSCALENE BLOCK   Additional requests/questions:      Astrid Divine   06/18/2021, 4:35 PM

## 2021-06-22 NOTE — Telephone Encounter (Signed)
Patient with diagnosis of afib on Eliquis for anticoagulation.    Procedure: RIGHT REVERSE TOTAL SHOULDER REPLACEMENT Date of procedure: TBD   CHA2DS2-VASc Score = 2   This indicates a 2.2% annual risk of stroke. The patient's score is based upon: CHF History: 0 HTN History: 1 Diabetes History: 1 Stroke History: 0 Vascular Disease History: 0 Age Score: 0 Gender Score: 0      CrCl 92 ml/min  Per office protocol, patient can hold Eliquis for 3 days prior to procedure.

## 2021-06-22 NOTE — Telephone Encounter (Signed)
° °  Primary Cardiologist: Dietrich Pates, MD  Chart reviewed as part of pre-operative protocol coverage. Given past medical history and time since last visit, based on ACC/AHA guidelines, Patrick Herrera would be at acceptable risk for the planned procedure without further cardiovascular testing.   According to the Revised Cardiac Risk Index (RCRI), his Perioperative Risk of Major Cardiac Event is (%): 0.4   His Functional Capacity in METs is: 7.99 according to the Duke Activity Status Index (DASI).  Patient with diagnosis of afib on Eliquis for anticoagulation.     Procedure: RIGHT REVERSE TOTAL SHOULDER REPLACEMENT Date of procedure: TBD     CHA2DS2-VASc Score = 2   This indicates a 2.2% annual risk of stroke. The patient's score is based upon: CHF History: 0 HTN History: 1 Diabetes History: 1 Stroke History: 0 Vascular Disease History: 0 Age Score: 0 Gender Score: 0       CrCl 92 ml/min   Per office protocol, patient can hold Eliquis for 3 days prior to procedure.  I will route this recommendation to the requesting party via Epic fax function and remove from pre-op pool.  Please call with questions.  Thomasene Ripple. Caprice Mccaffrey NP-C    06/22/2021, 12:09 PM [facility] [address] Office [phone] Fax [phone]

## 2021-08-27 ENCOUNTER — Ambulatory Visit: Payer: Medicare Other | Admitting: Nurse Practitioner

## 2021-09-21 ENCOUNTER — Other Ambulatory Visit: Payer: Self-pay

## 2021-09-21 ENCOUNTER — Encounter (HOSPITAL_BASED_OUTPATIENT_CLINIC_OR_DEPARTMENT_OTHER): Payer: Self-pay | Admitting: Orthopaedic Surgery

## 2021-09-29 ENCOUNTER — Encounter (HOSPITAL_BASED_OUTPATIENT_CLINIC_OR_DEPARTMENT_OTHER)
Admission: RE | Admit: 2021-09-29 | Discharge: 2021-09-29 | Disposition: A | Discharge status: Home / Self Care | Payer: Medicare Other | Source: Ambulatory Visit | Attending: Orthopaedic Surgery | Admitting: Orthopaedic Surgery

## 2021-09-29 DIAGNOSIS — Z79899 Other long term (current) drug therapy: Secondary | ICD-10-CM | POA: Diagnosis not present

## 2021-09-29 DIAGNOSIS — I4891 Unspecified atrial fibrillation: Secondary | ICD-10-CM | POA: Diagnosis not present

## 2021-09-29 DIAGNOSIS — F1120 Opioid dependence, uncomplicated: Secondary | ICD-10-CM | POA: Diagnosis not present

## 2021-09-29 DIAGNOSIS — Z01818 Encounter for other preprocedural examination: Secondary | ICD-10-CM | POA: Diagnosis present

## 2021-09-29 DIAGNOSIS — G894 Chronic pain syndrome: Secondary | ICD-10-CM | POA: Diagnosis not present

## 2021-09-29 DIAGNOSIS — K219 Gastro-esophageal reflux disease without esophagitis: Secondary | ICD-10-CM | POA: Diagnosis not present

## 2021-09-29 DIAGNOSIS — I10 Essential (primary) hypertension: Secondary | ICD-10-CM | POA: Diagnosis not present

## 2021-09-29 DIAGNOSIS — M19011 Primary osteoarthritis, right shoulder: Secondary | ICD-10-CM | POA: Diagnosis not present

## 2021-09-29 DIAGNOSIS — Z7901 Long term (current) use of anticoagulants: Secondary | ICD-10-CM | POA: Diagnosis not present

## 2021-09-29 DIAGNOSIS — Z96611 Presence of right artificial shoulder joint: Secondary | ICD-10-CM | POA: Diagnosis present

## 2021-09-29 DIAGNOSIS — F1290 Cannabis use, unspecified, uncomplicated: Secondary | ICD-10-CM | POA: Diagnosis not present

## 2021-09-29 LAB — SURGICAL PCR SCREEN
MRSA, PCR: NEGATIVE
Staphylococcus aureus: NEGATIVE

## 2021-09-29 LAB — BASIC METABOLIC PANEL
Anion gap: 6 (ref 5–15)
BUN: 15 mg/dL (ref 8–23)
CO2: 27 mmol/L (ref 22–32)
Calcium: 9.3 mg/dL (ref 8.9–10.3)
Chloride: 103 mmol/L (ref 98–111)
Creatinine, Ser: 0.77 mg/dL (ref 0.61–1.24)
GFR, Estimated: >60 mL/min (ref >60)
Glucose, Bld: 108 mg/dL — ABNORMAL HIGH (ref 70–99)
Potassium: 4.5 mmol/L (ref 3.5–5.1)
Sodium: 136 mmol/L (ref 135–145)

## 2021-09-29 NOTE — Progress Notes (Signed)
Patient called and reminded to come in for lab work. ?

## 2021-09-29 NOTE — Progress Notes (Signed)
? ? ? ? ?  Enhanced Recovery after Surgery for Orthopedics ?Enhanced Recovery after Surgery is a protocol used to improve the stress on your body and your recovery after surgery. ? ?Patient Instructions ? ?The night before surgery:  ?No food after midnight. ONLY clear liquids after midnight ? ?The day of surgery (if you do NOT have diabetes):  ?Drink ONE (1) Pre-Surgery Clear Ensure as directed.   ?This drink was given to you during your hospital  ?pre-op appointment visit. ?The pre-op nurse will instruct you on the time to drink the  ?Pre-Surgery Ensure depending on your surgery time. ?Finish the drink at the designated time by the pre-op nurse.  ?Nothing else to drink after completing the  ?Pre-Surgery Clear Ensure. ? ?The day of surgery (if you have diabetes): ?Drink ONE (1) Gatorade 2 (G2) as directed. ?This drink was given to you during your hospital  ?pre-op appointment visit.  ?The pre-op nurse will instruct you on the time to drink the  ? Gatorade 2 (G2) depending on your surgery time. ?Color of the Gatorade may vary. Red is not allowed. ?Nothing else to drink after completing the  ?Gatorade 2 (G2). ? ?       If you have questions, please contact your surgeon?s office. ? ?Patient received surgical soap and BPO with instructions. Patient verbalized understanding. ?

## 2021-09-30 ENCOUNTER — Ambulatory Visit (HOSPITAL_BASED_OUTPATIENT_CLINIC_OR_DEPARTMENT_OTHER): Payer: Medicare Other | Admitting: Anesthesiology

## 2021-09-30 ENCOUNTER — Encounter (HOSPITAL_BASED_OUTPATIENT_CLINIC_OR_DEPARTMENT_OTHER)
Admission: RE | Disposition: A | Discharge status: Home / Self Care | Payer: Self-pay | Source: Home / Self Care | Attending: Orthopaedic Surgery

## 2021-09-30 ENCOUNTER — Ambulatory Visit (HOSPITAL_BASED_OUTPATIENT_CLINIC_OR_DEPARTMENT_OTHER)
Admission: RE | Admit: 2021-09-30 | Discharge: 2021-10-01 | Disposition: A | Discharge status: Home / Self Care | Payer: Medicare Other | Attending: Orthopaedic Surgery | Admitting: Orthopaedic Surgery

## 2021-09-30 ENCOUNTER — Other Ambulatory Visit: Payer: Self-pay

## 2021-09-30 ENCOUNTER — Ambulatory Visit (HOSPITAL_COMMUNITY): Payer: Medicare Other

## 2021-09-30 ENCOUNTER — Encounter (HOSPITAL_BASED_OUTPATIENT_CLINIC_OR_DEPARTMENT_OTHER): Payer: Self-pay | Admitting: Orthopaedic Surgery

## 2021-09-30 DIAGNOSIS — K219 Gastro-esophageal reflux disease without esophagitis: Secondary | ICD-10-CM | POA: Insufficient documentation

## 2021-09-30 DIAGNOSIS — I10 Essential (primary) hypertension: Secondary | ICD-10-CM | POA: Diagnosis not present

## 2021-09-30 DIAGNOSIS — I4891 Unspecified atrial fibrillation: Secondary | ICD-10-CM

## 2021-09-30 DIAGNOSIS — Z96611 Presence of right artificial shoulder joint: Secondary | ICD-10-CM

## 2021-09-30 DIAGNOSIS — M12811 Other specific arthropathies, not elsewhere classified, right shoulder: Secondary | ICD-10-CM | POA: Diagnosis not present

## 2021-09-30 DIAGNOSIS — G894 Chronic pain syndrome: Secondary | ICD-10-CM | POA: Insufficient documentation

## 2021-09-30 DIAGNOSIS — F1120 Opioid dependence, uncomplicated: Secondary | ICD-10-CM | POA: Insufficient documentation

## 2021-09-30 DIAGNOSIS — Z7901 Long term (current) use of anticoagulants: Secondary | ICD-10-CM | POA: Insufficient documentation

## 2021-09-30 DIAGNOSIS — M19011 Primary osteoarthritis, right shoulder: Secondary | ICD-10-CM | POA: Insufficient documentation

## 2021-09-30 DIAGNOSIS — Z79899 Other long term (current) drug therapy: Secondary | ICD-10-CM | POA: Insufficient documentation

## 2021-09-30 DIAGNOSIS — Z01818 Encounter for other preprocedural examination: Secondary | ICD-10-CM

## 2021-09-30 DIAGNOSIS — M1990 Unspecified osteoarthritis, unspecified site: Secondary | ICD-10-CM | POA: Diagnosis not present

## 2021-09-30 DIAGNOSIS — F1290 Cannabis use, unspecified, uncomplicated: Secondary | ICD-10-CM | POA: Insufficient documentation

## 2021-09-30 HISTORY — DX: Gastro-esophageal reflux disease without esophagitis: K21.9

## 2021-09-30 HISTORY — PX: REVERSE SHOULDER ARTHROPLASTY: SHX5054

## 2021-09-30 HISTORY — DX: Prediabetes: R73.03

## 2021-09-30 LAB — GLUCOSE, CAPILLARY
Glucose-Capillary: 109 mg/dL — ABNORMAL HIGH (ref 70–99)
Glucose-Capillary: 125 mg/dL — ABNORMAL HIGH (ref 70–99)

## 2021-09-30 SURGERY — ARTHROPLASTY, SHOULDER, TOTAL, REVERSE
Anesthesia: Regional | Site: Shoulder | Laterality: Right

## 2021-09-30 MED ORDER — DOCUSATE SODIUM 100 MG PO CAPS
100.0000 mg | ORAL_CAPSULE | Freq: Two times a day (BID) | ORAL | Status: DC
  Administered 2021-09-30: 100 mg via ORAL
  Filled 2021-09-30: qty 1

## 2021-09-30 MED ORDER — CEFAZOLIN SODIUM-DEXTROSE 2-4 GM/100ML-% IV SOLN
2.0000 g | Freq: Four times a day (QID) | INTRAVENOUS | Status: DC
  Administered 2021-09-30: 2 g via INTRAVENOUS
  Filled 2021-09-30: qty 100

## 2021-09-30 MED ORDER — BUPIVACAINE LIPOSOME 1.3 % IJ SUSP
INTRAMUSCULAR | Status: DC | PRN
  Administered 2021-09-30: 10 mL

## 2021-09-30 MED ORDER — OXYCODONE HCL 5 MG/5ML PO SOLN: 5.0000 mg | Freq: Once | ORAL | Status: AC | PRN

## 2021-09-30 MED ORDER — ROCURONIUM BROMIDE 10 MG/ML (PF) SYRINGE
PREFILLED_SYRINGE | INTRAVENOUS | Status: AC
  Filled 2021-09-30: qty 10

## 2021-09-30 MED ORDER — METHOCARBAMOL 500 MG PO TABS
500.0000 mg | ORAL_TABLET | Freq: Four times a day (QID) | ORAL | Status: DC | PRN
Start: 2021-09-30 — End: 2021-10-01

## 2021-09-30 MED ORDER — OXYCODONE HCL 5 MG PO TABS: 5.0000 mg | ORAL_TABLET | ORAL | Status: DC | PRN

## 2021-09-30 MED ORDER — ACETAMINOPHEN 500 MG PO TABS
ORAL_TABLET | ORAL | Status: AC
  Filled 2021-09-30: qty 2

## 2021-09-30 MED ORDER — FENTANYL CITRATE (PF) 100 MCG/2ML IJ SOLN
INTRAMUSCULAR | Status: AC
  Filled 2021-09-30: qty 2

## 2021-09-30 MED ORDER — LIDOCAINE 2% (20 MG/ML) 5 ML SYRINGE
INTRAMUSCULAR | Status: AC
  Filled 2021-09-30: qty 5

## 2021-09-30 MED ORDER — CELECOXIB 100 MG PO CAPS: 100.0000 mg | ORAL_CAPSULE | Freq: Two times a day (BID) | ORAL | 0 refills | Status: AC

## 2021-09-30 MED ORDER — POLYETHYLENE GLYCOL 3350 17 G PO PACK: 17.0000 g | PACK | Freq: Every day | ORAL | Status: DC | PRN

## 2021-09-30 MED ORDER — HYDROMORPHONE HCL 1 MG/ML IJ SOLN
INTRAMUSCULAR | Status: AC
  Filled 2021-09-30: qty 0.5

## 2021-09-30 MED ORDER — ACETAMINOPHEN 500 MG PO TABS
1000.0000 mg | ORAL_TABLET | Freq: Once | ORAL | Status: AC
  Administered 2021-09-30: 1000 mg via ORAL

## 2021-09-30 MED ORDER — PHENYLEPHRINE HCL (PRESSORS) 10 MG/ML IV SOLN
INTRAVENOUS | Status: DC | PRN
  Administered 2021-09-30: 40 ug via INTRAVENOUS
  Administered 2021-09-30: 80 ug via INTRAVENOUS
  Administered 2021-09-30: 160 ug via INTRAVENOUS
  Administered 2021-09-30 (×2): 80 ug via INTRAVENOUS

## 2021-09-30 MED ORDER — DEXAMETHASONE SODIUM PHOSPHATE 10 MG/ML IJ SOLN
INTRAMUSCULAR | Status: AC
  Filled 2021-09-30: qty 1

## 2021-09-30 MED ORDER — GABAPENTIN 100 MG PO CAPS: 100.0000 mg | ORAL_CAPSULE | Freq: Three times a day (TID) | ORAL | 0 refills | Status: AC

## 2021-09-30 MED ORDER — METOCLOPRAMIDE HCL 5 MG PO TABS
5.0000 mg | ORAL_TABLET | Freq: Three times a day (TID) | ORAL | Status: DC | PRN
  Administered 2021-09-30: 10 mg via ORAL
  Filled 2021-09-30: qty 2

## 2021-09-30 MED ORDER — DEXAMETHASONE SODIUM PHOSPHATE 4 MG/ML IJ SOLN
INTRAMUSCULAR | Status: DC | PRN
  Administered 2021-09-30: 5 mg via INTRAVENOUS

## 2021-09-30 MED ORDER — METHOCARBAMOL 1000 MG/10ML IJ SOLN
500.0000 mg | Freq: Four times a day (QID) | INTRAVENOUS | Status: DC | PRN
  Filled 2021-09-30: qty 5

## 2021-09-30 MED ORDER — FENTANYL CITRATE (PF) 100 MCG/2ML IJ SOLN
50.0000 ug | Freq: Once | INTRAMUSCULAR | Status: AC
  Administered 2021-09-30: 50 ug via INTRAVENOUS

## 2021-09-30 MED ORDER — ACETAMINOPHEN 500 MG PO TABS: 1000.0000 mg | ORAL_TABLET | Freq: Three times a day (TID) | ORAL | 0 refills | Status: AC

## 2021-09-30 MED ORDER — ONDANSETRON HCL 4 MG/2ML IJ SOLN: 4.0000 mg | Freq: Once | INTRAMUSCULAR | Status: DC | PRN

## 2021-09-30 MED ORDER — BUPIVACAINE HCL (PF) 0.5 % IJ SOLN
INTRAMUSCULAR | Status: DC | PRN
  Administered 2021-09-30: 20 mL

## 2021-09-30 MED ORDER — PANTOPRAZOLE SODIUM 40 MG PO TBEC
80.0000 mg | DELAYED_RELEASE_TABLET | Freq: Every day | ORAL | Status: DC
Start: 2021-10-01 — End: 2021-10-01

## 2021-09-30 MED ORDER — VANCOMYCIN HCL 1000 MG IV SOLR
INTRAVENOUS | Status: DC | PRN
  Administered 2021-09-30: 1000 mg via TOPICAL

## 2021-09-30 MED ORDER — ACETAMINOPHEN 500 MG PO TABS: 1000.0000 mg | ORAL_TABLET | Freq: Once | ORAL | Status: DC

## 2021-09-30 MED ORDER — TRANEXAMIC ACID-NACL 1000-0.7 MG/100ML-% IV SOLN
1000.0000 mg | INTRAVENOUS | Status: AC
  Administered 2021-09-30: 1000 mg via INTRAVENOUS

## 2021-09-30 MED ORDER — AMISULPRIDE (ANTIEMETIC) 5 MG/2ML IV SOLN
INTRAVENOUS | Status: AC
Start: 2021-09-30 — End: ?
  Filled 2021-09-30: qty 2

## 2021-09-30 MED ORDER — PROPOFOL 10 MG/ML IV BOLUS
INTRAVENOUS | Status: DC | PRN
  Administered 2021-09-30: 150 mg via INTRAVENOUS

## 2021-09-30 MED ORDER — GABAPENTIN 100 MG PO CAPS: 100.0000 mg | ORAL_CAPSULE | Freq: Three times a day (TID) | ORAL | Status: DC

## 2021-09-30 MED ORDER — VANCOMYCIN HCL 1000 MG IV SOLR
INTRAVENOUS | Status: AC
Start: 2021-09-30 — End: ?
  Filled 2021-09-30: qty 20

## 2021-09-30 MED ORDER — CEFAZOLIN SODIUM-DEXTROSE 2-4 GM/100ML-% IV SOLN
INTRAVENOUS | Status: AC
  Filled 2021-09-30: qty 100

## 2021-09-30 MED ORDER — ONDANSETRON HCL 4 MG PO TABS
4.0000 mg | ORAL_TABLET | Freq: Four times a day (QID) | ORAL | Status: DC | PRN
  Administered 2021-09-30 – 2021-10-01 (×3): 4 mg via ORAL
  Filled 2021-09-30 (×3): qty 1

## 2021-09-30 MED ORDER — MIDAZOLAM HCL 2 MG/2ML IJ SOLN
1.0000 mg | Freq: Once | INTRAMUSCULAR | Status: AC
Start: 2021-09-30 — End: 2021-09-30
  Administered 2021-09-30: 1 mg via INTRAVENOUS

## 2021-09-30 MED ORDER — ROCURONIUM BROMIDE 100 MG/10ML IV SOLN
INTRAVENOUS | Status: DC | PRN
  Administered 2021-09-30: 30 mg via INTRAVENOUS
  Administered 2021-09-30: 70 mg via INTRAVENOUS

## 2021-09-30 MED ORDER — BISACODYL 5 MG PO TBEC: 5.0000 mg | DELAYED_RELEASE_TABLET | Freq: Every day | ORAL | Status: DC | PRN

## 2021-09-30 MED ORDER — GABAPENTIN 100 MG PO CAPS
ORAL_CAPSULE | ORAL | Status: AC
  Filled 2021-09-30: qty 1

## 2021-09-30 MED ORDER — ALPRAZOLAM 0.25 MG PO TABS
0.5000 mg | ORAL_TABLET | Freq: Two times a day (BID) | ORAL | Status: DC
  Administered 2021-09-30 (×2): 0.5 mg via ORAL
  Filled 2021-09-30 (×2): qty 2

## 2021-09-30 MED ORDER — GABAPENTIN 300 MG PO CAPS
300.0000 mg | ORAL_CAPSULE | Freq: Once | ORAL | Status: AC
  Administered 2021-09-30: 300 mg via ORAL

## 2021-09-30 MED ORDER — CEFAZOLIN SODIUM-DEXTROSE 2-4 GM/100ML-% IV SOLN
2.0000 g | INTRAVENOUS | Status: AC
  Administered 2021-09-30: 2 g via INTRAVENOUS

## 2021-09-30 MED ORDER — LACTATED RINGERS IV BOLUS
500.0000 mL | Freq: Once | INTRAVENOUS | Status: AC
Start: 2021-09-30 — End: 2021-09-30
  Administered 2021-09-30: 500 mL via INTRAVENOUS

## 2021-09-30 MED ORDER — ONDANSETRON HCL 4 MG/2ML IJ SOLN
INTRAMUSCULAR | Status: AC
  Filled 2021-09-30: qty 2

## 2021-09-30 MED ORDER — SUGAMMADEX SODIUM 200 MG/2ML IV SOLN
INTRAVENOUS | Status: DC | PRN
  Administered 2021-09-30: 500 mg via INTRAVENOUS

## 2021-09-30 MED ORDER — HYDROMORPHONE HCL 1 MG/ML IJ SOLN
0.2500 mg | INTRAMUSCULAR | Status: DC | PRN
  Administered 2021-09-30 (×3): 0.5 mg via INTRAVENOUS

## 2021-09-30 MED ORDER — HYDROMORPHONE HCL 1 MG/ML IJ SOLN: 0.5000 mg | INTRAMUSCULAR | Status: DC | PRN

## 2021-09-30 MED ORDER — LIDOCAINE HCL (CARDIAC) PF 100 MG/5ML IV SOSY
PREFILLED_SYRINGE | INTRAVENOUS | Status: DC | PRN
  Administered 2021-09-30: 80 mg via INTRAVENOUS

## 2021-09-30 MED ORDER — KETOROLAC TROMETHAMINE 15 MG/ML IJ SOLN: 15.0000 mg | Freq: Four times a day (QID) | INTRAMUSCULAR | Status: DC

## 2021-09-30 MED ORDER — 0.9 % SODIUM CHLORIDE (POUR BTL) OPTIME
TOPICAL | Status: DC | PRN
  Administered 2021-09-30: 1000 mL

## 2021-09-30 MED ORDER — METOCLOPRAMIDE HCL 5 MG/ML IJ SOLN: 5.0000 mg | Freq: Three times a day (TID) | INTRAMUSCULAR | Status: DC | PRN

## 2021-09-30 MED ORDER — LACTATED RINGERS IV SOLN: INTRAVENOUS | Status: DC

## 2021-09-30 MED ORDER — AMISULPRIDE (ANTIEMETIC) 5 MG/2ML IV SOLN
10.0000 mg | Freq: Once | INTRAVENOUS | Status: AC | PRN
  Administered 2021-09-30: 10 mg via INTRAVENOUS

## 2021-09-30 MED ORDER — ROSUVASTATIN CALCIUM 10 MG PO TABS
10.0000 mg | ORAL_TABLET | Freq: Every day | ORAL | Status: DC
  Filled 2021-09-30: qty 1

## 2021-09-30 MED ORDER — PROPOFOL 10 MG/ML IV BOLUS
INTRAVENOUS | Status: AC
Start: 2021-09-30 — End: ?
  Filled 2021-09-30: qty 20

## 2021-09-30 MED ORDER — OXYCODONE HCL 5 MG PO TABS
15.0000 mg | ORAL_TABLET | ORAL | Status: DC | PRN
  Administered 2021-09-30: 15 mg via ORAL
  Filled 2021-09-30: qty 3

## 2021-09-30 MED ORDER — ONDANSETRON HCL 4 MG/2ML IJ SOLN
INTRAMUSCULAR | Status: DC | PRN
  Administered 2021-09-30: 4 mg via INTRAVENOUS

## 2021-09-30 MED ORDER — ZOLPIDEM TARTRATE 5 MG PO TABS: 5.0000 mg | ORAL_TABLET | Freq: Every evening | ORAL | Status: DC | PRN

## 2021-09-30 MED ORDER — GABAPENTIN 300 MG PO CAPS
ORAL_CAPSULE | ORAL | Status: AC
  Filled 2021-09-30: qty 1

## 2021-09-30 MED ORDER — VANCOMYCIN HCL 1000 MG IV SOLR
INTRAVENOUS | Status: AC
  Filled 2021-09-30: qty 20

## 2021-09-30 MED ORDER — METOPROLOL TARTRATE 5 MG/5ML IV SOLN
INTRAVENOUS | Status: DC | PRN
  Administered 2021-09-30: 2 mg via INTRAVENOUS

## 2021-09-30 MED ORDER — SODIUM CHLORIDE (PF) 0.9 % IJ SOLN
INTRAMUSCULAR | Status: DC | PRN
  Administered 2021-09-30: 1000 mL

## 2021-09-30 MED ORDER — ACETAMINOPHEN 500 MG PO TABS
1000.0000 mg | ORAL_TABLET | Freq: Four times a day (QID) | ORAL | Status: DC
  Administered 2021-09-30 – 2021-10-01 (×3): 1000 mg via ORAL
  Filled 2021-09-30 (×3): qty 2

## 2021-09-30 MED ORDER — APIXABAN 5 MG PO TABS
5.0000 mg | ORAL_TABLET | Freq: Two times a day (BID) | ORAL | Status: DC
  Filled 2021-09-30: qty 1

## 2021-09-30 MED ORDER — OXYCODONE HCL 5 MG PO TABS
5.0000 mg | ORAL_TABLET | Freq: Once | ORAL | Status: AC | PRN
  Administered 2021-09-30: 5 mg via ORAL
  Filled 2021-09-30: qty 1

## 2021-09-30 MED ORDER — ONDANSETRON HCL 4 MG/2ML IJ SOLN: 4.0000 mg | Freq: Four times a day (QID) | INTRAMUSCULAR | Status: DC | PRN

## 2021-09-30 MED ORDER — TRANEXAMIC ACID-NACL 1000-0.7 MG/100ML-% IV SOLN
INTRAVENOUS | Status: AC
  Filled 2021-09-30: qty 100

## 2021-09-30 MED ORDER — ONDANSETRON HCL 4 MG PO TABS: 4.0000 mg | ORAL_TABLET | Freq: Three times a day (TID) | ORAL | 0 refills | Status: AC | PRN

## 2021-09-30 MED ORDER — OXYCODONE HCL 5 MG PO TABS: ORAL_TABLET | ORAL | 0 refills | Status: AC

## 2021-09-30 MED ORDER — MIDAZOLAM HCL 2 MG/2ML IJ SOLN
INTRAMUSCULAR | Status: AC
  Filled 2021-09-30: qty 2

## 2021-09-30 MED ORDER — LACTATED RINGERS IV BOLUS
250.0000 mL | Freq: Once | INTRAVENOUS | Status: AC
Start: 2021-09-30 — End: 2021-09-30
  Administered 2021-09-30: 250 mL via INTRAVENOUS

## 2021-09-30 SURGICAL SUPPLY — 74 items
BIT DRILL 3.2 PERIPHERAL SCREW (BIT) ×1 IMPLANT
BLADE HEX COATED 2.75 (ELECTRODE) IMPLANT
BLADE SAW SAG 73X25 THK (BLADE) ×1
BLADE SAW SGTL 73X25 THK (BLADE) ×1 IMPLANT
BLADE SURG 10 STRL SS (BLADE) IMPLANT
BLADE SURG 15 STRL LF DISP TIS (BLADE) IMPLANT
BLADE SURG 15 STRL SS (BLADE)
BNDG COHESIVE 4X5 TAN ST LF (GAUZE/BANDAGES/DRESSINGS) IMPLANT
BRUSH SCRUB EZ PLAIN DRY (MISCELLANEOUS) ×2 IMPLANT
CHLORAPREP W/TINT 26 (MISCELLANEOUS) ×3 IMPLANT
COOLER ICEMAN CLASSIC (MISCELLANEOUS) ×2 IMPLANT
COVER BACK TABLE 60X90IN (DRAPES) ×2 IMPLANT
COVER MAYO STAND STRL (DRAPES) ×2 IMPLANT
DRAPE IMP U-DRAPE 54X76 (DRAPES) ×2 IMPLANT
DRAPE INCISE IOBAN 66X45 STRL (DRAPES) ×2 IMPLANT
DRAPE U-SHAPE 76X120 STRL (DRAPES) ×4 IMPLANT
DRSG AQUACEL AG ADV 3.5X 6 (GAUZE/BANDAGES/DRESSINGS) ×2 IMPLANT
ELECT BLADE 4.0 EZ CLEAN MEGAD (MISCELLANEOUS) ×2
ELECT REM PT RETURN 9FT ADLT (ELECTROSURGICAL) ×2
ELECTRODE BLDE 4.0 EZ CLN MEGD (MISCELLANEOUS) ×1 IMPLANT
ELECTRODE REM PT RTRN 9FT ADLT (ELECTROSURGICAL) ×1 IMPLANT
FACESHIELD WRAPAROUND (MASK) ×4 IMPLANT
FACESHIELD WRAPAROUND OR TEAM (MASK) ×2 IMPLANT
GLENOID BASEPLATE 29 +3 (Joint) ×1 IMPLANT
GLENOSPHERE CANN +3 (Orthopedic Implant) ×1 IMPLANT
GLOVE BIO SURGEON STRL SZ 6.5 (GLOVE) ×5 IMPLANT
GLOVE BIOGEL PI IND STRL 6.5 (GLOVE) ×1 IMPLANT
GLOVE BIOGEL PI IND STRL 7.0 (GLOVE) IMPLANT
GLOVE BIOGEL PI IND STRL 8 (GLOVE) ×1 IMPLANT
GLOVE BIOGEL PI IND STRL 8.5 (GLOVE) IMPLANT
GLOVE BIOGEL PI INDICATOR 6.5 (GLOVE) ×1
GLOVE BIOGEL PI INDICATOR 7.0 (GLOVE) ×1
GLOVE BIOGEL PI INDICATOR 8 (GLOVE) ×1
GLOVE BIOGEL PI INDICATOR 8.5 (GLOVE) ×1
GLOVE ECLIPSE 8.0 STRL XLNG CF (GLOVE) ×4 IMPLANT
GOWN STRL REUS W/ TWL LRG LVL3 (GOWN DISPOSABLE) ×2 IMPLANT
GOWN STRL REUS W/TWL LRG LVL3 (GOWN DISPOSABLE) ×4
GOWN STRL REUS W/TWL XL LVL3 (GOWN DISPOSABLE) ×2 IMPLANT
GUIDE PIN 3X75 SHOULDER (PIN) ×2
GUIDEWIRE GLENOID 2.5X220 (WIRE) ×1 IMPLANT
HANDPIECE INTERPULSE COAX TIP (DISPOSABLE) ×2
INSERT HUM THK+3X.75 39XSTRL (Joint) IMPLANT
INSERT REVERSED THICKNESS +3 (Joint) ×2 IMPLANT
INSRT HUM THK+3X.75 39XSTRL (Joint) ×1 IMPLANT
KIT SHOULDER STAB MARCO (KITS) ×2 IMPLANT
MANIFOLD NEPTUNE II (INSTRUMENTS) ×2 IMPLANT
PACK BASIN DAY SURGERY FS (CUSTOM PROCEDURE TRAY) ×2 IMPLANT
PACK SHOULDER (CUSTOM PROCEDURE TRAY) ×2 IMPLANT
PAD COLD SHLDR WRAP-ON (PAD) ×2 IMPLANT
PAD ORTHO SHOULDER 7X19 LRG (SOFTGOODS) ×2 IMPLANT
PENCIL SMOKE EVACUATOR (MISCELLANEOUS) IMPLANT
PIN GUIDE 3X75 SHOULDER (PIN) IMPLANT
RESTRAINT HEAD UNIVERSAL NS (MISCELLANEOUS) ×2 IMPLANT
SCREW 5.5X22 (Screw) ×1 IMPLANT
SCREW BONE 6.5X40 SM (Screw) ×1 IMPLANT
SCREW PERIPHERAL 5.0X34 (Screw) ×1 IMPLANT
SET HNDPC FAN SPRY TIP SCT (DISPOSABLE) ×1 IMPLANT
SHEET MEDIUM DRAPE 40X70 STRL (DRAPES) ×2 IMPLANT
SLEEVE SCD COMPRESS KNEE MED (STOCKING) ×2 IMPLANT
SPIKE FLUID TRANSFER (MISCELLANEOUS) IMPLANT
SPONGE T-LAP 18X18 ~~LOC~~+RFID (SPONGE) ×2 IMPLANT
STEM HUMERAL STD SHORT SZ3 (Joint) ×1 IMPLANT
STRIP CLOSURE SKIN 1/2X4 (GAUZE/BANDAGES/DRESSINGS) ×2 IMPLANT
SUT ETHIBOND 2 V 37 (SUTURE) ×2 IMPLANT
SUT ETHIBOND NAB CT1 #1 30IN (SUTURE) ×2 IMPLANT
SUT FIBERWIRE #5 38 CONV NDL (SUTURE) ×8
SUT MNCRL AB 4-0 PS2 18 (SUTURE) ×2 IMPLANT
SUT VIC AB 0 CT1 27 (SUTURE)
SUT VIC AB 0 CT1 27XBRD ANBCTR (SUTURE) IMPLANT
SUT VIC AB 3-0 SH 27 (SUTURE) ×2
SUT VIC AB 3-0 SH 27X BRD (SUTURE) ×1 IMPLANT
SUTURE FIBERWR #5 38 CONV NDL (SUTURE) ×4 IMPLANT
TOWEL GREEN STERILE FF (TOWEL DISPOSABLE) ×6 IMPLANT
TUBE SUCTION HIGH CAP CLEAR NV (SUCTIONS) ×2 IMPLANT

## 2021-09-30 NOTE — Transfer of Care (Signed)
Immediate Anesthesia Transfer of Care Note ? ?Patient: Patrick Herrera ? ?Procedure(s) Performed: REVERSE SHOULDER ARTHROPLASTY (Right: Shoulder) ? ?Patient Location: PACU ? ?Anesthesia Type:GA combined with regional for post-op pain ? ?Level of Consciousness: awake, alert  and oriented ? ?Airway & Oxygen Therapy: Patient Spontanous Breathing and Patient connected to face mask oxygen ? ?Post-op Assessment: Report given to RN and Post -op Vital signs reviewed and stable ? ?Post vital signs: Reviewed and stable ? ?Last Vitals:  ?Vitals Value Taken Time  ?BP 136/86 09/30/21 1110  ?Temp    ?Pulse 110 09/30/21 1114  ?Resp 27 09/30/21 1114  ?SpO2 94 % 09/30/21 1114  ?Vitals shown include unvalidated device data. ? ?Last Pain:  ?Vitals:  ? 09/30/21 0820  ?TempSrc:   ?PainSc: 8   ?   ? ?Patients Stated Pain Goal: 5 (09/30/21 0804) ? ?Complications: No notable events documented. ?

## 2021-09-30 NOTE — Discharge Instructions (Addendum)
Ophelia Charter MD, MPH ?Noemi Chapel, PA-C ?[facility] ?[address] ?[phone] (tel)   ?[phone] (fax) ? ? ?POST-OPERATIVE INSTRUCTIONS - TOTAL SHOULDER REPLACEMENT  ? ? ?WOUND CARE ?You may leave the operative dressing in place until your follow-up appointment. ?KEEP THE INCISIONS CLEAN AND DRY. ?There may be a small amount of fluid/bleeding leaking at the surgical site. This is normal after surgery.  ?If it fills with liquid or blood please call us immediately to change it for you. ?Use the provided ice machine or Ice packs as often as possible for the first 3-4 days, then as needed for pain relief.   ?Keep a layer of cloth or a shirt between your skin and the cooling unit to prevent frost bite as it can get very cold. ? ?SHOWERING: ?- You may shower on Post-Op Day #2.  ?- The dressing is water resistant but do not scrub it as it may start to peel up.   ?- You may remove the sling for showering ?- Gently pat the area dry.  ?- Do not soak the shoulder in water. Do not go swimming in the pool or ocean until your incision has completely healed (about 4-6 weeks after surgery).  ?- KEEP THE INCISIONS CLEAN AND DRY. ? ?EXERCISES ?Wear the sling at all times ?You may remove the sling for showering, but keep the arm across the chest or in a secondary sling.    ?Accidental/Purposeful External Rotation and shoulder flexion (reaching behind you) is to be avoided at all costs for the first month. ?Do not lift anything heavier than 1 pound until we discuss it further in clinic. ?It is normal for your fingers/hand to become more swollen after surgery and discolored from bruising.   ?This will resolve over the first few weeks usually after surgery. ?Please continue to ambulate and do not stay sitting or lying for too long.  ?Perform foot and wrist pumps to assist in circulation. ? ?PHYSICAL THERAPY ?- You have a physical therapy appointment at [facility] (across the hall from our office) on  Tuesday, May 2nd at 1 pm ? ?[place] ?The anesthesia team may have performed a nerve block for you if safe in the setting of your care.  This is a great tool used to minimize pain.  Typically the block may start wearing off overnight but the long acting medicine may last for 3-4 days.  The nerve block wearing off can be a challenging period but please utilize your as needed pain medications to try and manage this period.   ? ?POST-OP MEDICATIONS- Multimodal approach to pain control ?In general your pain will be controlled with a combination of substances.  Prescriptions unless otherwise discussed are electronically sent to your pharmacy.  This is a carefully made plan we use to minimize narcotic use.    ? ?Celebrex - Anti-inflammatory medication taken on a scheduled basis ?Acetaminophen - Non-narcotic pain medicine taken on a scheduled basis  ?Gabapentin - this is a medication to help with pain, take on a scheduled basis ?Oxycodone - This is a strong narcotic, to be used only on an ?as needed? basis for SEVERE pain. ?Zofran -  take as needed for nausea ? ?You may resume your Eliquis 24 hours after surgery ? ?FOLLOW-UP ?If you develop a Fever (>101.5), Redness or Drainage from the surgical incision site, please call our office to arrange for an evaluation. ?Please call the office to schedule a follow-up appointment for  a wound check, 7-10 days post-operatively. ? ?IF YOU HAVE ANY QUESTIONS, PLEASE FEEL FREE TO CALL OUR OFFICE. ? ?REGIONAL ANESTHESIA (NERVE BLOCKS) ?The anesthesia team may have performed a nerve block for you this is a great tool used to minimize pain.   ?The block may start wearing off overnight (between 8-24 hours postop) ?When the block wears off, your pain may go from nearly zero to the pain you would have had postop without the block. ?This is an abrupt transition but nothing dangerous is happening.   ?This can be a challenging period but utilize your as needed pain  medications to try and manage this period. ?We suggest you use the pain medication the first night prior to going to bed, to ease this transition.  ?You may take an extra dose of narcotic when this happens if needed ? ?You should wean off your narcotic medicines as soon as you are able.   ?Most patients will be off or using minimal narcotics before their first postop appointment.  ? ?HELPFUL INFORMATION ? ?If you had a block, it will wear off between 8-24 hrs postop typically.  This is period when your pain may go from nearly zero to the pain you would have had post-op without the block.  This is an abrupt transition but nothing dangerous is happening.  You may take an extra dose of narcotic when this happens. ? ?Your arm will be in a sling following surgery. You will be in this sling for the next 4 weeks.   ? ?You may be more comfortable sleeping in a semi-seated position the first few nights following surgery.  Keep a pillow propped under the elbow and forearm for comfort.  If you have a recliner type of chair it might be beneficial.  If not that is fine too, but it would be helpful to sleep propped up with pillows behind your operated shoulder as well under your elbow and forearm.  This will reduce pulling on the suture lines. ? ?When dressing, put your operative arm in the sleeve first.  When getting undressed, take your operative arm out last.  Loose fitting, button-down shirts are recommended. ? ?In most states it is against the law to drive while your arm is in a sling. And certainly against the law to drive while taking narcotics. ? ?You may return to work/school in the next couple of days when you feel up to it. Desk work and typing in the sling is fine. ? ?We suggest you use the pain medication the first night prior to going to bed, in order to ease any pain when the anesthesia wears off. You should avoid taking pain medications on an empty stomach as it will make you nauseous. ? ?Do not drink alcoholic  beverages or take illicit drugs when taking pain medications. ? ?Pain medication may make you constipated.  Below are a few solutions to try in this order: ?Decrease the amount of pain medication if you aren?t having pain. ?Drink lots of decaffeinated fluids. ?Drink prune juice and/or each dried prunes ? ?If the first 3 don?t work start with additional solutions ?Take Colace - an over-the-counter stool softener ?Take Senokot - an over-the-counter laxative ?Take Miralax - a stronger over-the-counter laxative ? ? ?Dental Antibiotics: ? ?In most cases prophylactic antibiotics for Dental procdeures after total joint surgery are not necessary. ? ?Exceptions are as follows: ? ?1. History of prior total joint infection ? ?2. Severely immunocompromised (Organ Transplant, cancer chemotherapy, Rheumatoid biologic ?meds  such as [place] ? ?3. Poorly controlled diabetes (A1C &gt; 8.0, blood glucose over 200) ? ?If you have one of these conditions, contact your surgeon for an antibiotic prescription, prior to your ?dental procedure. ? ? ?For more information including helpful videos and documents visit our website:  ? ?[email or web address] ? ? ?Information for Discharge Teaching: ?EXPAREL (bupivacaine liposome injectable suspension)  ? ?Your surgeon or anesthesiologist gave you EXPAREL(bupivacaine) to help control your pain after surgery.  ?EXPAREL is a local anesthetic that provides pain relief by numbing the tissue around the surgical site. ?EXPAREL is designed to release pain medication over time and can control pain for up to 72 hours. ?Depending on how you respond to EXPAREL, you may require less pain medication during your recovery. ? ?Possible side effects: ?Temporary loss of sensation or ability to move in the area where bupivacaine was injected. ?Nausea, vomiting, constipation ?Rarely, numbness and tingling in your mouth or lips, lightheadedness, or anxiety may occur. ?Call your doctor  right away if you think you may be experiencing any of these sensations, or if you have other questions regarding possible side effects. ? ?Follow all other discharge instructions given to you by your surgeon

## 2021-09-30 NOTE — Interval H&P Note (Signed)
All questions answered, patient wants to proceed with procedure. ? ?

## 2021-09-30 NOTE — Anesthesia Procedure Notes (Signed)
Anesthesia Regional Block: Interscalene brachial plexus block  ? ?Pre-Anesthetic Checklist: , timeout performed,  Correct Patient, Correct Site, Correct Laterality,  Correct Procedure, Correct Position, site marked,  Risks and benefits discussed,  Surgical consent,  Pre-op evaluation,  At surgeon's request and post-op pain management ? ?Laterality: Right ? ?Prep: chloraprep     ?  ?Needles:  ?Injection technique: Single-shot ? ?Needle Type: Echogenic Stimulator Needle   ? ? ?Needle Length: 10cm  ?Needle Gauge: 20  ? ? ? ?Additional Needles: ? ? ?Procedures:,,,, ultrasound used (permanent image in chart),,    ?Narrative:  ?Start time: 09/30/2021 8:20 AM ?End time: 09/30/2021 8:25 AM ?Injection made incrementally with aspirations every 5 mL. ? ?Performed by: Personally  ?Anesthesiologist: Merlinda Frederick, MD ? ?Additional Notes: ?Standard monitors applied. Skin prepped. Good needle visualization with ultrasound. Injection made in 5cc increments with no resistance to injection. Patient tolerated the procedure well. ? ? ? ? ?

## 2021-09-30 NOTE — Op Note (Signed)
Orthopaedic Surgery Operative Note (CSN: [id number]) ? ?Patrick Herrera  [birth year] ?Date of Surgery: 09/30/2021 ? ? ?Diagnoses:  ?Right rotator cuff arthropathy ? ?Procedure: ?Right reverse lateralized total Shoulder Arthroplasty ?  ?Operative Finding ?Successful completion of planned procedure.  Axillary nerve tug test was normal, superior cuff was completely torn.  Bone quality was reasonable. ? ?Post-operative plan: The patient will be NWB in sling.  The patient will be the monitor overnight due to social reasons.  DVT prophylaxis not indicated as patient already on full dose anticoagulant.  Pain control with PRN pain medication preferring oral medicines.  Follow up plan will be scheduled in approximately 7 days for incision check and XR.  Physical therapy to start after discharge ? ?Implants: Tornier size 3 perform humeral stem, +3 polyethylene, 39+3 glenosphere, 29+3 baseplate with a 40 center screw and 2 peripheral locking screws ? ?Post-Op Diagnosis: Same ?Surgeons:Primary: Hiram Gash, MD ?Assistants:Caroline McBane PA-C ?Location: [facility] ROOM 2 ?Anesthesia: General with Exparel Interscalene ?Antibiotics: Ancef 2g preop, Vancomycin 1000mg  locally ?Tourniquet time: None ?Estimated Blood Loss: 100 ?Complications: None ?Specimens: None ?Implants: ?Implant Name Type Inv. Item Serial No. Manufacturer Lot No. LRB No. Used Action  ?GLENOID BASEPLATE 29 +3 - [id number] Joint GLENOID BASEPLATE 29 +3 075-GRM [employer]  Right 1 Implanted  ?SCREW BONE 6.5X40 SM - [id number] Screw SCREW BONE 6.5X40 SM  [employer] ON STERILE TRAY Right 1 Implanted  ?SCREW PERIPHERAL 5.0X34 - [id number] Screw SCREW PERIPHERAL 5.0X34  [employer] ON STERILE TRAY Right 1 Implanted  ?SCREW 5.5X22 - [id number] Screw SCREW 5.5X22  [employer] ON STERILE TRAY Right 1 Implanted  ?TORNIER PERFORM REVERSED CANNULATED LATERALIZED GLENOSPHERE 0.39 mm   [id number]   Right 1 Implanted  ?INSERT REVERSED THICKNESS +3 - [id number] Joint INSERT  REVERSED THICKNESS +3 [id number] [employer]  Right 1 Implanted  ?STEM HUMERAL STD SHORT SZ3 - [id number] Joint STEM HUMERAL STD SHORT SZ3 [id number] [employer]  Right 1 Implanted  ? ? ?Indications for Surgery:   ?Patrick Herrera is a 66 y.o. male with cuff tear arthropathy and chronic pain syndrome.  Benefits and risks of operative and nonoperative management were discussed prior to surgery with patient/guardian(s) and informed consent form was completed.  Infection and need for further surgery were discussed as was prosthetic stability and cuff issues.  We additionally specifically discussed risks of axillary nerve injury, infection, periprosthetic fracture, continued pain and longevity of implants prior to beginning procedure.   ? ? ? ?Procedure:   ?The patient was identified in the preoperative holding area where the surgical site was marked. Block placed by anesthesia with exparel.  The patient was taken to the OR where a procedural timeout was called and the above noted anesthesia was induced.  The patient was positioned beachchair on allen table with spider arm positioner.  Preoperative antibiotics were dosed.  The patient's right shoulder was prepped and draped in the usual sterile fashion.  A second preoperative timeout was called.     ? ? ?Standard deltopectoral approach was performed with a #10 blade. We dissected down to the subcutaneous tissues and the cephalic vein was taken laterally with the deltoid. Clavipectoral fascia was incised in line with the incision. Deep retractors were placed. The long of the biceps tendon was identified and there was significant tenosynovitis present.  Tenodesis was performed to the pectoralis tendon with #2 Ethibond. The remaining biceps was followed up into the rotator interval where it was released.  ? ?The subscapularis  was taken down in a full thickness layer with capsule along the humeral neck extending inferiorly around the humeral head. We continued releasing the  capsule directly off of the osteophytes inferiorly all the way around the corner. This allowed [place] to dislocate the humeral head.  ? ?The humeral head had evidence of severe osteoarthritic wear with full-thickness cartilage loss and exposed subchondral bone.  ? ?The rotator cuff was carefully examined and noted to be irreperably torn.  The decision was confirmed that a reverse total shoulder was indicated for this patient. ? ?There were osteophytes along the inferior humeral neck. The osteophytes were removed with an osteotome and a rongeur.  Osteophytes were removed with a rongeur and an osteotome and the anatomic neck was well visualized.    ? ?A humeral cutting guide was used extra medullary with a pin to help control version. The version was set at 20? of retroversion. Humeral osteotomy was performed with an oscillating saw. The head fragment was passed off the back table.  A cut protector plate was placed. ? ?The subscapularis was again identified and immediately we took care to palpate the axillary nerve anteriorly and verify its position with gentle palpation as well as the tug test.  We then released the SGHL with bovie cautery prior to placing a curved mayo at the junction of the anterior glenoid well above the axillary nerve and bluntly dissecting the subscapularis from the capsule.  We then carefully protected the axillary nerve as we gently released the inferior capsule to fully mobilize the subscapularis.  An anterior deltoid retractor was then placed as well as a small Hohmann retractor superiorly. ? ?The glenoid was relatively intact in the setting of an irreparable cuff tear ? ?The remaining labrum was removed circumferentially taking great care not to disrupt the posterior capsule.  ? ?The glenoid drill guide was placed and used to drill a guide pin in the center, inferior position. The glenoid face was then reamed concentrically over the guide wire. The center hole was drilled over the guidepin in a  near anatomic angle of version. Next the glenoid vault was drilled back to a depth of 40 mm.  We tapped and then placed a 14mm size baseplate with additional 71mm lateralization was selected with a 6.5 mm x 40 mm length central screw.  The base plate was screwed into the glenoid vault obtaining secure fixation. We next placed superior and inferior locking screws for additional fixation.  Next a 39 +3 mm glenosphere was selected and impacted onto the baseplate. The center screw was tightened. ? ?We turned attention back to the humeral side. The cut protector was removed.  We used the perform humeral sizing block to select the appropriate size which for this patient was a 3.  We then placed our center pin and reamed over it concentrically obtaining appropriate inset.  We then used our lateralizing chisel to prepare the lateral aspect of the humerus.  At that point we selected the appropriate implant trialing a 3.  Using this trial implant we trialed multiple polyethylene sizes settling on a 3 which provided good stability and range of motion without excess soft tissue tension. The offset was dialed in to match the normal anatomy. The shoulder was trialed.  There was good ROM in all planes and the shoulder was stable with no inferior translation. ? ?The real humeral implants were opened after again confirming sizes.  The trial was removed. #5 Fiberwire x4 sutures passed through the humeral neck  for subscap repair. The humeral component was press-fit obtaining a secure fit. The joint was reduced and thoroughly irrigated with pulsatile lavage. Subscap was repaired back with #5 Fiberwire sutures through bone tunnels. Hemostasis was obtained. The deltopectoral interval was reapproximated with #1 Ethibond. The subcutaneous tissues were closed with 2-0 Vicryl and the skin was closed with running monocryl.   ? ?The wounds were cleaned and dried and an Aquacel dressing was placed. The drapes taken down. The arm was placed into  sling with abduction pillow. Patient was awakened, extubated, and transferred to the recovery room in stable condition. There were no intraoperative complications. The sponge, needle, and attention counts were

## 2021-09-30 NOTE — H&P (Signed)
? ? ?PREOPERATIVE H&P ? ?Chief Complaint: right shoulder DJD ? ?HPI: ?Patrick Herrera is a 66 y.o. male who is scheduled for Procedure(s): ?REVERSE SHOULDER ARTHROPLASTY.  ? ?Patient has a past medical history significant for atrial fibrillation, HTN, HLD, GERD.  ? ?Patient has had right shoulder pain for quite some time. He has had multiple surgeries by another provider. He has no reported history of infection. He is frustrated by the function of his right shoulder. He has had previous issues with a nerve block. 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  ? ?Symptoms are rated as moderate to severe, and have been worsening.  This is significantly impairing activities of daily living.   ? ?Please see clinic note for further details on this patient's care.   ? ?He has elected for surgical management.  ? ?Past Medical History:  ?Diagnosis Date  ? Atrial fibrillation ([place])   ? Chronic pain   ? GERD (gastroesophageal reflux disease)   ? Hyperlipidemia   ? Hypertension   ? Kidney stones   ? Pre-diabetes   ? ?Past Surgical History:  ?Procedure Laterality Date  ? bil knee surgery    ? ELBOW SURGERY    ? NECK SURGERY    ? x4  ? SHOULDER SURGERY    ? x4  ? ?Social History  ? ?Socioeconomic History  ? Marital status: Divorced  ?  Spouse name: Not on file  ? Number of children: Not on file  ? Years of education: Not on file  ? Highest education level: Not on file  ?Occupational History  ? Not on file  ?Tobacco Use  ? Smoking status: Never  ? Smokeless tobacco: Never  ?Vaping Use  ? Vaping Use: Never used  ?Substance and Sexual Activity  ? Alcohol use: Yes  ?  Comment: social   ? Drug use: Yes  ?  Types: Marijuana, Oxycodone  ?  Comment: last smoked 09-19-21, oxy daily for chronic pain from pain clinic  ? Sexual activity: Not Currently  ?Other Topics Concern  ? Not on file  ?Social History Narrative  ? Not on file  ? ?Social Determinants of Health  ? ?Financial Resource Strain: Not on file  ?Food Insecurity: Not on file   ?Transportation Needs: Not on file  ?Physical Activity: Not on file  ?Stress: Not on file  ?Social Connections: Not on file  ? ?Family History  ?Problem Relation Age of Onset  ? Heart attack Mother   ? ?Allergies  ?Allergen Reactions  ? Fentanyl Nausea And Vomiting  ? Oxycodone Nausea And Vomiting  ? Penicillins Nausea And Vomiting  ?  Has patient had a PCN reaction causing immediate rash, facial/tongue/throat swelling, SOB or lightheadedness with hypotension: no ?Has patient had a PCN reaction causing severe rash involving mucus membranes or skin necrosis: no No ?Has patient had a PCN reaction that required hospitalization: No ?Has patient had a PCN reaction occurring within the last 10 years: No ?If all of the above answers are "NO", then may proceed with Cephalosporin use. ?  ? ?Prior to Admission medications   ?Medication Sig Start Date End Date Taking? Authorizing Provider  ?ELIQUIS 5 MG TABS tablet Take 1 tablet (5 mg total) by mouth 2 (two) times daily. 03/29/21  Yes Fay Records, MD  ?esomeprazole (NEXIUM) 40 MG capsule Take 40 mg by mouth daily at 12 noon.   Yes [provider]  ?oxyCODONE-acetaminophen (PERCOCET) 7.5-325 MG tablet Take 1 tablet by mouth  every 4 (four) hours as needed for severe pain.   Yes [provider]  ?rosuvastatin (CRESTOR) 10 MG tablet Take 10 mg by mouth daily.   Yes [provider]  ?hydrochlorothiazide (MICROZIDE) 12.5 MG capsule Take 1 capsule (12.5 mg total) by mouth in the morning. ?Patient not taking: Reported on 05/10/2021 03/29/21 06/27/21  Fay Records, MD  ?propranolol ER (INDERAL LA) 160 MG SR capsule Take 1 capsule (160 mg total) by mouth daily. ?Patient not taking: Reported on 05/10/2021 03/29/21   Fay Records, MD  ? ? ?ROS: All other systems have been reviewed and were otherwise negative with the exception of those mentioned in the HPI and as above. ? ?Physical Exam: ?General: Alert, no acute distress ?Cardiovascular: No pedal  edema ?Respiratory: No cyanosis, no use of accessory musculature ?GI: No organomegaly, abdomen is soft and non-tender ?Skin: No lesions in the area of chief complaint ?Neurologic: Sensation intact distally ?Psychiatric: Patient is competent for consent with normal mood and affect ?Lymphatic: No axillary or cervical lymphadenopathy ? ?MUSCULOSKELETAL:  ?Range of motion of the right shoulder to 90 degrees, limited by pain.  External rotation of 20.  Internal rotation to his front pocket.  Cuff strength is 4/5 throughout. ? ?Imaging: ?Right cuff tear arthropathy.  [facility].   ? ?BMI: ?Body mass index is 30.41 kg/m?. ? ?No results found for: ALBUMIN ? ? ?Diabetes: ?Patient does not have a diagnosis of diabetes. ?  ? ?.  ?  ?Smoking Status: ?  ? ? ? ? ? ? ? ? ? ? ? ?Assessment: ?right shoulder DJD ? ?Plan: ?Plan for Procedure(s): ?REVERSE SHOULDER ARTHROPLASTY ? ?The risks benefits and alternatives were discussed with the patient including but not limited to the risks of nonoperative treatment, versus surgical intervention including infection, bleeding, nerve injury,  blood clots, cardiopulmonary complications, morbidity, mortality, among others, and they were willing to proceed.  ? ?We additionally specifically discussed risks of axillary nerve injury, infection, periprosthetic fracture, continued pain and longevity of implants prior to beginning procedure.  ?  ?Patient will be closely monitored in PACU for medical stabilization and pain control. If found stable in PACU, patient may be discharged home with outpatient follow-up. If any concerns regarding patient's stabilization patient will be admitted for observation after surgery. The patient is planning to be discharged home with outpatient PT.  ? ?The patient acknowledged the explanation, agreed to proceed with the plan and consent was signed.  ? ?He received operative clearance from PCP, Cipriano Mile NP, and cardiologist Dr. Dorris Carnes. ? ?Operative Plan: Right  reverse total shoulder arthroplasty  ?Discharge Medications: Tylenol, Celebrex, Oxycodone, Gabapentin, Zofran ?DVT Prophylaxis: Aspirin ?Physical Therapy: Outpatient PT ?Special Discharge needs: Sling. IceMan ? ? ?Ethelda Chick, PA-C ? ?09/30/2021 ?6:28 AM ? ?

## 2021-09-30 NOTE — Anesthesia Preprocedure Evaluation (Addendum)
Anesthesia Evaluation  ?Patient identified by MRN, date of birth, ID band ?Patient awake ? ? ? ?Reviewed: ?Allergy & Precautions, NPO status , Patient's Chart, lab work & pertinent test results ? ?Airway ?Mallampati: II ? ?TM Distance: >3 FB ?Neck ROM: Full ? ? ? Dental ? ?(+) Partial Upper ?  ?Pulmonary ?neg pulmonary ROS,  ?  ?Pulmonary exam normal ?breath sounds clear to auscultation ? ? ? ? ? ? Cardiovascular ?hypertension, Normal cardiovascular exam+ dysrhythmias Atrial Fibrillation  ?Rhythm:Regular Rate:Normal ? ? ?  ?Neuro/Psych ?negative neurological ROS ? negative psych ROS  ? GI/Hepatic ?GERD  ,(+)  ?  ? substance abuse ? marijuana use,   ?Endo/Other  ?prediabetes ? Renal/GU ?Renal disease (kidney stones)  ?negative genitourinary ?  ?Musculoskeletal ? ?(+) Arthritis , narcotic dependentChronic pain  ? Abdominal ?  ?Peds ?negative pediatric ROS ?(+)  Hematology ?negative hematology ROS ?(+)   ?Anesthesia Other Findings ? ? Reproductive/Obstetrics ?negative OB ROS ? ?  ? ? ? ? ? ? ? ? ? ? ? ? ? ?  ?  ? ? ? ? ? ? ? ?Anesthesia Physical ?Anesthesia Plan ? ?ASA: 3 ? ?Anesthesia Plan: General and Regional  ? ?Post-op Pain Management:   ? ?Induction: Intravenous ? ?PONV Risk Score and Plan: 2 and Treatment may vary due to age or medical condition, Ondansetron, Dexamethasone and Midazolam ? ?Airway Management Planned: Oral ETT ? ?Additional Equipment: None ? ?Intra-op Plan:  ? ?Post-operative Plan: Extubation in OR ? ?Informed Consent: I have reviewed the patients History and Physical, chart, labs and discussed the procedure including the risks, benefits and alternatives for the proposed anesthesia with the patient or authorized representative who has indicated his/her understanding and acceptance.  ? ? ? ?Dental advisory given ? ?Plan Discussed with: CRNA, Anesthesiologist and Surgeon ? ?Anesthesia Plan Comments: (Patient states he can take oxycodone but Oxycontin causes him  nausea/vomiting. Tanna Furry, MD  ?)  ? ? ? ? ? ?Anesthesia Quick Evaluation ? ?

## 2021-09-30 NOTE — Anesthesia Postprocedure Evaluation (Signed)
Anesthesia Post Note ? ?Patient: Patrick Herrera ? ?Procedure(s) Performed: REVERSE SHOULDER ARTHROPLASTY (Right: Shoulder) ? ?  ? ?Patient location during evaluation: PACU ?Anesthesia Type: Regional and General ?Level of consciousness: awake ?Pain management: pain level controlled ?Vital Signs Assessment: post-procedure vital signs reviewed and stable ?Respiratory status: spontaneous breathing and respiratory function stable ?Cardiovascular status: stable ?Postop Assessment: no apparent nausea or vomiting ?Anesthetic complications: no ? ? ?No notable events documented. ? ?Last Vitals:  ?Vitals:  ? 09/30/21 1145 09/30/21 1200  ?BP: 125/81 (!) 155/101  ?Pulse: (!) 103 92  ?Resp: (!) 21 (!) 22  ?Temp:    ?SpO2: 97% 94%  ?  ?Last Pain:  ?Vitals:  ? 09/30/21 1143  ?TempSrc:   ?PainSc: 8   ? ? ?  ?  ?  ?  ?  ?  ? ?Merlinda Frederick ? ? ? ? ?

## 2021-09-30 NOTE — Anesthesia Procedure Notes (Signed)
Procedure Name: Intubation ?Date/Time: 09/30/2021 10:08 AM ?Performed by: Verita Lamb, CRNA ?Pre-anesthesia Checklist: Patient identified, Emergency Drugs available, Suction available and Patient being monitored ?Patient Re-evaluated:Patient Re-evaluated prior to induction ?Oxygen Delivery Method: Circle system utilized ?Preoxygenation: Pre-oxygenation with 100% oxygen ?Induction Type: IV induction ?Ventilation: Mask ventilation without difficulty ?Laryngoscope Size: Mac and 4 ?Grade View: Grade I ?Tube type: Oral ?Tube size: 7.0 mm ?Number of attempts: 1 ?Airway Equipment and Method: Stylet and Oral airway ?Placement Confirmation: ETT inserted through vocal cords under direct vision, positive ETCO2, breath sounds checked- equal and bilateral and CO2 detector ?Secured at: 22 cm ?Tube secured with: Tape ?Dental Injury: Teeth and Oropharynx as per pre-operative assessment  ? ? ? ? ?

## 2021-09-30 NOTE — Progress Notes (Signed)
Assisted Dr. Donavan Foil with right, ultrasound guided block. Side rails up, monitors on throughout procedure. See vital signs in flow sheet. Tolerated Procedure well. ?

## 2021-10-01 ENCOUNTER — Encounter (HOSPITAL_BASED_OUTPATIENT_CLINIC_OR_DEPARTMENT_OTHER): Payer: Self-pay | Admitting: Orthopaedic Surgery

## 2021-10-01 DIAGNOSIS — M19011 Primary osteoarthritis, right shoulder: Secondary | ICD-10-CM | POA: Diagnosis not present

## 2021-10-27 ENCOUNTER — Telehealth: Payer: Self-pay | Admitting: Internal Medicine

## 2021-10-27 MED ORDER — ROSUVASTATIN CALCIUM 10 MG PO TABS: 10.0000 mg | ORAL_TABLET | Freq: Every day | ORAL | 3 refills | Status: AC

## 2021-10-27 NOTE — Telephone Encounter (Signed)
*  STAT* If patient is at the pharmacy, call can be transferred to refill team.   1. Which medications need to be refilled? (please list name of each medication and dose if known) rosuvastatin (CRESTOR) 10 MG tablet  2. Which pharmacy/location (including street and city if local pharmacy) is medication to be sent to? [place], [place] [id number]  3. Do they need a 30 day or 90 day supply? 90 day supply   Has been out for a couple of days.

## 2021-10-27 NOTE — Telephone Encounter (Signed)
Completed.

## 2021-11-17 NOTE — Progress Notes (Deleted)
Cardiology Office Note   Date:  11/17/2021   ID:  Patrick Herrera, DOB [birth year], MRN [id number]  PCP:  Patrick Aldo, NP  Cardiologist:   Patrick Pates, MD   Patient presents for follow up of atrial fibrillation and HTN     History of Present Illness: Patrick Herrera is a 66 y.o. male  who was previously followed at [facility] in [place] in June 2022 by Dr Patrick Herrera.  Seen for irregular heart beat, tachycardia and presurgical clearance    PMH significant for atrial fibrillation, HTN, Type II DM, HL, obesity.   The pt was diagnosed with atrial fibrillatoin in APril 2022   Placed on propranolol and Eliquis  Echo and stress toest ordered     Also, with BP elevatoin, patient was started on HCTZ 12.5 mg     Neither echo or sterss test were done   The pt denies palpitaitons   He does say he gets occasional chest tightness but it is not associated with activity    Denis dizziness      I saw the pt in clinic in October 2022   Since then he has been seen by Patrick Herrera     No outpatient medications have been marked as taking for the 11/19/21 encounter (Appointment) with Patrick Riffle, MD.     Allergies:   Fentanyl, Oxycontin [oxycodone hcl], and Penicillins   Past Medical History:  Diagnosis Date   Atrial fibrillation (HCC)    Chronic pain    GERD (gastroesophageal reflux disease)    Hyperlipidemia    Hypertension    Kidney stones    Pre-diabetes     Past Surgical History:  Procedure Laterality Date   bil knee surgery     ELBOW SURGERY     NECK SURGERY     x4   REVERSE SHOULDER ARTHROPLASTY Right 09/30/2021   Procedure: REVERSE SHOULDER ARTHROPLASTY;  Surgeon: Patrick Pippin, MD;  Location: [facility];  Service: Orthopedics;  Laterality: Right;   SHOULDER SURGERY     x4     Social History:  The patient  reports that he has never smoked. He has never used smokeless tobacco. He reports current alcohol use. He reports current drug use. Drugs: Marijuana and  Oxycodone.   Family History:  The patient's family history includes Heart attack in his mother.    ROS:  Please see the history of present illness. All other systems are reviewed and  Negative to the above problem except as noted.    PHYSICAL EXAM: VS:  There were no vitals taken for this visit.  GEN: Well nourished, well developed, in no acute distress  HEENT: normal  Neck: no JVD, carotid bruits, or masses Cardiac:Irreg irreg   No murmurs   NO LE  edema  Respiratory:  clear to auscultation bilaterally, normal work of breathing GI: soft, nontender, nondistended, + BS  No hepatomegaly  MS: no deformity Moving all extremities   Skin: warm and dry, no rash Neuro:  Strength and sensation are intact Psych: euthymic mood, full affect   EKG:  EKG is not ordered today.   Lipid Panel No results found for: "CHOL", "TRIG", "HDL", "CHOLHDL", "VLDL", "LDLCALC", "LDLDIRECT"    Wt Readings from Last 3 Encounters:  09/30/21 199 lb 8.3 oz (90.5 kg)  05/10/21 212 lb 9.6 oz (96.4 kg)  03/30/21 209 lb 14.1 oz (95.2 kg)      ASSESSMENT AND PLAN:  1  Atrial  fibrillation    Will set up for an echo to eval LVEF as well as chaber sizzes.    Also set up for Zio patch to eval overall HR control Keep on same meds   2 Cardiac risk stratiification.  Pt with Hx of DM   His symtpoms are atypcia for ischemia but given hx I would recomm a Lexiscan myoview to r/o inducible ischemia  E  HTN  BP is high   Neds to be followed at home   I would not make any changes now  4   HL   Needs to get lipids from PCP   He should be on a statin   5  DM   Discussed diet.   Limit carbs  Time restricted eating     Current medicines are reviewed at length with the patient today.  The patient does not have concerns regarding medicines.  Signed, Patrick Pates, MD  11/17/2021 9:11 PM    [facility] [address] [place], [place], [place]  [address] Phone: [phone]; Fax: [phone]

## 2021-11-19 ENCOUNTER — Ambulatory Visit: Payer: Medicare Other | Admitting: Internal Medicine

## 2022-02-10 ENCOUNTER — Telehealth: Payer: Self-pay | Admitting: Internal Medicine

## 2022-02-10 NOTE — Telephone Encounter (Signed)
Pt states he needs a letter from Dr. Tenny Craw to take with him to court in regards to the stress test she had ordered for him and the results explaining his heart condition. He states he needs this before his court date on Monday. Requesting call back.

## 2022-02-10 NOTE — Telephone Encounter (Signed)
Pt states that he would like a copy of his last stress test.

## 2022-02-21 NOTE — Progress Notes (Deleted)
Cardiology Office Note:    Date:  02/21/2022   ID:  Patrick Herrera, DOB [birth year], MRN [id number]  PCP:  Cipriano Mile, [place] [facility] Cardiologist:  Dorris Carnes, MD { Click to update primary MD,subspecialty MD or APP then REFRESH:1}  *** Referring MD: Phillips Odor, MD   Chief Complaint:  No chief complaint on file. {Click here for Visit Info    :1}    History of Present Illness:   Patrick Herrera is a 66 y.o. male with history of permanent atrial fibrillation, hypertension, HLD, DM2   Patient last saw Dr. Harrington Challenger 03/29/2021 at which time she ordered stress test for chest pain that was low risk no ischemia, 2D echo with normal LVEF and Holter monitor showed A. fib was permanent rate controlled.     I saw the patient 05/2021 and doing well. Had shoulder surgery 09/2021.     Past Medical History:  Diagnosis Date   Atrial fibrillation ([place])    Chronic pain    GERD (gastroesophageal reflux disease)    Hyperlipidemia    Hypertension    Kidney stones    Pre-diabetes    Current Medications: No outpatient medications have been marked as taking for the 02/28/22 encounter (Appointment) with Imogene Burn, PA-C.    Allergies:   Fentanyl, Oxycontin [oxycodone hcl], and Penicillins   Social History   Tobacco Use   Smoking status: Never   Smokeless tobacco: Never  Vaping Use   Vaping Use: Never used  Substance Use Topics   Alcohol use: Yes    Comment: social    Drug use: Yes    Types: Marijuana, Oxycodone    Comment: last smoked 09-19-21, oxy daily for chronic pain from pain clinic    Family Hx: The patient's family history includes Heart attack in his mother.  ROS   EKGs/Labs/Other Test Reviewed:    EKG:  EKG is *** ordered today.  The ekg ordered today demonstrates ***  Recent Labs: 03/30/2021: Hemoglobin 15.6; Platelets 254 09/29/2021: BUN 15; Creatinine, Ser 0.77; Potassium 4.5; Sodium 136   Recent Lipid Panel No results for input(s): "CHOL",  "TRIG", "HDL", "VLDL", "LDLCALC", "LDLDIRECT" in the last 8760 hours.   Prior CV Studies: {Select studies to display:26339}    Holter monitor 04/06/2021   Patch Wear Time:  2 days and 0 hours (2022-10-24T14:46:45-0400 to 2022-10-26T15:06:59-0400)   Atrial Fibrillation occurred continuously (100% burden), ranging from 48-175 bpm (avg of 77 bpm). No Isolated VEs, VE Couplets, or VE Triplets were present.    2D echo 04/06/2021 IMPRESSIONS     1. Left ventricular ejection fraction, by estimation, is 65 to 70%. The  left ventricle has normal function. The left ventricle has no regional  wall motion abnormalities. Left ventricular diastolic parameters are  indeterminate.   2. Right ventricular systolic function is normal. The right ventricular  size is normal. Tricuspid regurgitation signal is inadequate for assessing  PA pressure.   3. Left atrial size was upper normal.   4. Right atrial size was upper normal.   5. The mitral valve is grossly normal. Mild mitral valve regurgitation.   6. The aortic valve is tricuspid. Aortic valve regurgitation is not  visualized.   7. The inferior vena cava is normal in size with greater than 50%  respiratory variability, suggesting right atrial pressure of 3 mmHg.   Comparison(s): No prior Echocardiogram.    NST 04/06/2021   Findings are equivocal. The study is low risk.  No ST deviation was noted. Atrial fibrillation present throughout with heart rate increasing to the 140s following Lexiscan. The ECG was negative for ischemia.   LV perfusion is equivocal.  Small, mild intensity, partially reversible apical inferolateral defect with evidence of diaphragmatic attenuation as well.  Cannot exclude mild ischemic territory in this distribution   Left ventricular function is normal. Nuclear stress EF: 69 %.     Risk Assessment/Calculations/Metrics:   {Does this patient have ATRIAL FIBRILLATION?:[id number]}     No BP recorded.  {Refresh Note OR Click  here to enter BP  :1}***    Physical Exam:    VS:  There were no vitals taken for this visit.    Wt Readings from Last 3 Encounters:  09/30/21 199 lb 8.3 oz (90.5 kg)  05/10/21 212 lb 9.6 oz (96.4 kg)  03/30/21 209 lb 14.1 oz (95.2 kg)    Physical Exam  GEN: Well nourished, well developed, in no acute distress  HEENT: normal  Neck: no JVD, carotid bruits, or masses Cardiac:RRR; no murmurs, rubs, or gallops  Respiratory:  clear to auscultation bilaterally, normal work of breathing GI: soft, nontender, nondistended, + BS Ext: without cyanosis, clubbing, or edema, Good distal pulses bilaterally MS: no deformity or atrophy  Skin: warm and dry, no rash Neuro:  Alert and Oriented x 3, Strength and sensation are intact Psych: euthymic mood, full affect       ASSESSMENT & PLAN:   No problem-specific Assessment & Plan notes found for this encounter.   Permanent Afib  HTN  HLD  DM2  History of chest pain with normal NST 04/2021      {Are you ordering a CV Procedure (e.g. stress test, cath, DCCV, TEE, etc)?   Press F2        :[id number]}   Dispo:  No follow-ups on file.   Medication Adjustments/Labs and Tests Ordered: Current medicines are reviewed at length with the patient today.  Concerns regarding medicines are outlined above.  Tests Ordered: No orders of the defined types were placed in this encounter.  Medication Changes: No orders of the defined types were placed in this encounter.  Elson Clan, PA-C  02/21/2022 12:00 PM    [facility] [address] [place], [place], [place]  [address] Phone: [phone]; Fax: [phone]

## 2022-02-28 ENCOUNTER — Ambulatory Visit: Payer: Medicare Other | Admitting: Physician Assistant

## 2022-02-28 DIAGNOSIS — E119 Type 2 diabetes mellitus without complications: Secondary | ICD-10-CM

## 2022-02-28 DIAGNOSIS — E785 Hyperlipidemia, unspecified: Secondary | ICD-10-CM

## 2022-02-28 DIAGNOSIS — R072 Precordial pain: Secondary | ICD-10-CM

## 2022-02-28 DIAGNOSIS — I10 Essential (primary) hypertension: Secondary | ICD-10-CM

## 2022-02-28 DIAGNOSIS — I4819 Other persistent atrial fibrillation: Secondary | ICD-10-CM

## 2022-03-01 ENCOUNTER — Ambulatory Visit: Payer: Medicare Other | Admitting: Cardiology

## 2022-04-27 ENCOUNTER — Other Ambulatory Visit: Payer: Self-pay | Admitting: Internal Medicine

## 2022-04-27 DIAGNOSIS — I10 Essential (primary) hypertension: Secondary | ICD-10-CM

## 2022-05-05 NOTE — Progress Notes (Unsigned)
Cardiology Office Note    Date:  05/07/2022   ID:  MIHRAN Herrera, DOB [birth year], MRN [id number]  PCP:  Hillery Aldo, NP  Cardiologist: Dietrich Pates, MD    Chief Complaint  Patient presents with   Follow-up    6 month visit    History of Present Illness:    Patrick Herrera is a 66 y.o. male with past medical history of permanent atrial fibrillation, HTN, HLD and Type II DM who presents to the office today for overdue 54-month follow-up.  He was last examined by [name], PA-C in 05/2021 and was walking for 1 mile intervals without any anginal symptoms. He was planning to undergo shoulder replacement. Given his recent reassuring low-risk stress test and echocardiogram, no further cardiac testing was pursued.  In talking with the patient today, he reports overall doing well since his last office visit. He has continued with dietary changes as previously discussed with Dr. Tenny Craw and has lost an additional 20 lbs since his last office visit! Has also been able to stop prior pain medications. Says his breathing and stamina have improved with the changes. He denies any recent chest pain or palpitations. No specific orthopnea, PND or pitting edema. Does have occasional ankle edema which he accredits to his arthritis and multiple surgeries. Remains on Eliquis for anticoagulation and has been tolerating this well.   Past Medical History:  Diagnosis Date   Atrial fibrillation (HCC)    Chronic pain    GERD (gastroesophageal reflux disease)    Hyperlipidemia    Hypertension    Kidney stones    Pre-diabetes     Past Surgical History:  Procedure Laterality Date   bil knee surgery     ELBOW SURGERY     NECK SURGERY     x4   REVERSE SHOULDER ARTHROPLASTY Right 09/30/2021   Procedure: REVERSE SHOULDER ARTHROPLASTY;  Surgeon: Bjorn Pippin, MD;  Location: [facility];  Service: Orthopedics;  Laterality: Right;   SHOULDER SURGERY     x4    Current Medications: Outpatient  Medications Prior to Visit  Medication Sig Dispense Refill   esomeprazole (NEXIUM) 40 MG capsule Take 40 mg by mouth daily at 12 noon.     rosuvastatin (CRESTOR) 10 MG tablet Take 1 tablet (10 mg total) by mouth daily. 90 tablet 3   ELIQUIS 5 MG TABS tablet Take 1 tablet (5 mg total) by mouth 2 (two) times daily. 60 tablet 11   hydrochlorothiazide (MICROZIDE) 12.5 MG capsule Take 1 capsule by mouth in the morning 90 capsule 0   lisinopril (ZESTRIL) 5 MG tablet Take 5 mg by mouth daily.     propranolol ER (INDERAL LA) 160 MG SR capsule Take 1 capsule (160 mg total) by mouth daily. (Patient not taking: Reported on 05/10/2021) 90 capsule 3   No facility-administered medications prior to visit.     Allergies:   Fentanyl, Oxycontin [oxycodone hcl], and Penicillins   Social History   Socioeconomic History   Marital status: Divorced    Spouse name: Not on file   Number of children: Not on file   Years of education: Not on file   Highest education level: Not on file  Occupational History   Not on file  Tobacco Use   Smoking status: Never   Smokeless tobacco: Never  Vaping Use   Vaping Use: Never used  Substance and Sexual Activity   Alcohol use: Yes    Comment: social  Drug use: Yes    Types: Marijuana, Oxycodone    Comment: last smoked 09-19-21, oxy daily for chronic pain from pain clinic   Sexual activity: Not Currently  Other Topics Concern   Not on file  Social History Narrative   Not on file   Social Determinants of Health   Financial Resource Strain: Not on file  Food Insecurity: Not on file  Transportation Needs: Not on file  Physical Activity: Not on file  Stress: Not on file  Social Connections: Not on file     Family History:  The patient's family history includes Heart attack in his mother.   Review of Systems:    Please see the history of present illness.     All other systems reviewed and are otherwise negative except as noted above.   Physical Exam:     VS:  BP 118/60   Pulse (!) 120   Ht 5\' 8"  (1.727 m)   Wt 178 lb 12.8 oz (81.1 kg)   SpO2 99%   BMI 27.19 kg/m    General: Pleasant male appearing in no acute distress. Head: Normocephalic, atraumatic. Neck: No carotid bruits. JVD not elevated.  Lungs: Respirations regular and unlabored, without wheezes or rales.  Heart: Irregularly irregular. No S3 or S4.  No murmur, no rubs, or gallops appreciated. Abdomen: Appears non-distended. No obvious abdominal masses. Msk:  Strength and tone appear normal for age. No obvious joint deformities or effusions. Extremities: No clubbing or cyanosis. Trace ankle edema bilaterally.  Distal pedal pulses are 2+ bilaterally. Neuro: Alert and oriented X 3. Moves all extremities spontaneously. No focal deficits noted. Psych:  Responds to questions appropriately with a normal affect. Skin: No rashes or lesions noted  Wt Readings from Last 3 Encounters:  05/06/22 178 lb 12.8 oz (81.1 kg)  09/30/21 199 lb 8.3 oz (90.5 kg)  05/10/21 212 lb 9.6 oz (96.4 kg)     Studies/Labs Reviewed:   EKG:  EKG is ordered today.  The ekg ordered today demonstrates atrial fibrillation with RVR, HR 120.   Recent Labs: 09/29/2021: BUN 15; Creatinine, Ser 0.77; Potassium 4.5; Sodium 136   Lipid Panel No results found for: "CHOL", "TRIG", "HDL", "CHOLHDL", "VLDL", "LDLCALC", "LDLDIRECT"  Additional studies/ records that were reviewed today include:   NST: 04/2021   Findings are equivocal. The study is low risk.   No ST deviation was noted. Atrial fibrillation present throughout with heart rate increasing to the 140s following Lexiscan. The ECG was negative for ischemia.   LV perfusion is equivocal.  Small, mild intensity, partially reversible apical inferolateral defect with evidence of diaphragmatic attenuation as well.  Cannot exclude mild ischemic territory in this distribution   Left ventricular function is normal. Nuclear stress EF: 69 %.   Low risk study with  possible mild apical inferolateral ischemic territory in the setting of diaphragmatic attenuation, LVEF 69%.  Echocardiogram: 04/2021 IMPRESSIONS     1. Left ventricular ejection fraction, by estimation, is 65 to 70%. The  left ventricle has normal function. The left ventricle has no regional  wall motion abnormalities. Left ventricular diastolic parameters are  indeterminate.   2. Right ventricular systolic function is normal. The right ventricular  size is normal. Tricuspid regurgitation signal is inadequate for assessing  PA pressure.   3. Left atrial size was upper normal.   4. Right atrial size was upper normal.   5. The mitral valve is grossly normal. Mild mitral valve regurgitation.   6. The aortic  valve is tricuspid. Aortic valve regurgitation is not  visualized.   7. The inferior vena cava is normal in size with greater than 50%  respiratory variability, suggesting right atrial pressure of 3 mmHg.   Comparison(s): No prior Echocardiogram.   Event Monitor: 04/2021 Patch Wear Time:  2 days and 0 hours (2022-10-24T14:46:45-0400 to 2022-10-26T15:06:59-0400)   Atrial Fibrillation occurred continuously (100% burden), ranging from 48-175 bpm (avg of 77 bpm). No Isolated VEs, VE Couplets, or VE Triplets were present.    Assessment:    1. Permanent atrial fibrillation ([place])   2. Essential hypertension   3. Medication management   4. Mixed hyperlipidemia      Plan:   In order of problems listed above:  1. Permanent Atrial Fibrillation - His HR was initially in the 120's today, rechecked and in the low-100's. He has been off Propranolol ER 160mg  daily due to not having refills of the medication. Will restart and have him come back for a nurse visit for a repeat EKG and BP check in 2 weeks to make sure this has improved.  - Continue Eliquis 5mg  BID for anticoagulation. Recheck CBC and CMET.   2. HTN - His BP is well-controlled at 118/60 during today's visit. He is  currently taking HCTZ 12.5mg  daily and Lisinopril 5mg  daily. Will arrange for a nurse visit in 2 weeks and pending his BP trend following resumption of Propranolol, may be able to stop Lisinopril given his weight loss. Would continue with HCTZ given intermittent lower extremity edema. Recheck CMET for reassessment of renal function and electrolytes.   3. HLD - Will request most recent labs from his PCP. He remains on Crestor 10mg  daily.    Medication Adjustments/Labs and Tests Ordered: Current medicines are reviewed at length with the patient today.  Concerns regarding medicines are outlined above.  Medication changes, Labs and Tests ordered today are listed in the Patient Instructions below. Patient Instructions  Medication Instructions:  Your physician has recommended you make the following change in your medication:  -Restart Inderall 160 mg tablets daily   Labwork: CBC CMET  Testing/Procedures: None  Follow-Up: Follow up with Bernerd Pho, PA-C or Dr. Harrington Challenger in 6 months. Follow up in 2 weeks for Burse Visit- EKG/BP Check  Any Other Special Instructions Will Be Listed Below (If Applicable).     If you need a refill on your cardiac medications before your next appointment, please call your pharmacy.    Signed, Erma Heritage, PA-C  05/07/2022 9:48 AM    [place] [facility] [address] [place], [place] [address] Phone: [phone] Fax: [phone]

## 2022-05-06 ENCOUNTER — Ambulatory Visit: Payer: Medicare Other | Attending: Cardiology | Admitting: Student

## 2022-05-06 ENCOUNTER — Encounter: Payer: Self-pay | Admitting: Student

## 2022-05-06 VITALS — BP 118/60 | HR 120 | Ht 68.0 in | Wt 178.8 lb

## 2022-05-06 DIAGNOSIS — I4819 Other persistent atrial fibrillation: Secondary | ICD-10-CM

## 2022-05-06 DIAGNOSIS — I10 Essential (primary) hypertension: Secondary | ICD-10-CM

## 2022-05-06 DIAGNOSIS — I4821 Permanent atrial fibrillation: Secondary | ICD-10-CM | POA: Diagnosis not present

## 2022-05-06 DIAGNOSIS — E782 Mixed hyperlipidemia: Secondary | ICD-10-CM | POA: Diagnosis not present

## 2022-05-06 DIAGNOSIS — Z79899 Other long term (current) drug therapy: Secondary | ICD-10-CM | POA: Diagnosis not present

## 2022-05-06 MED ORDER — LISINOPRIL 5 MG PO TABS: 5.0000 mg | ORAL_TABLET | Freq: Every day | ORAL | 3 refills | Status: AC

## 2022-05-06 MED ORDER — HYDROCHLOROTHIAZIDE 12.5 MG PO CAPS: 12.5000 mg | ORAL_CAPSULE | Freq: Every morning | ORAL | 3 refills | Status: DC

## 2022-05-06 MED ORDER — PROPRANOLOL HCL ER 160 MG PO CP24: 160.0000 mg | ORAL_CAPSULE | Freq: Every day | ORAL | 3 refills | Status: DC

## 2022-05-06 MED ORDER — ELIQUIS 5 MG PO TABS: 5.0000 mg | ORAL_TABLET | Freq: Two times a day (BID) | ORAL | 3 refills | Status: DC

## 2022-05-06 NOTE — Patient Instructions (Addendum)
Medication Instructions:  Your physician has recommended you make the following change in your medication:  -Restart Inderall 160 mg tablets daily   Labwork: CBC CMET  Testing/Procedures: None  Follow-Up: Follow up with Randall An, PA-C or Dr. Tenny Craw in 6 months. Follow up in 2 weeks for Burse Visit- EKG/BP Check  Any Other Special Instructions Will Be Listed Below (If Applicable).     If you need a refill on your cardiac medications before your next appointment, please call your pharmacy.

## 2022-05-07 ENCOUNTER — Encounter: Payer: Self-pay | Admitting: Student

## 2022-05-20 ENCOUNTER — Ambulatory Visit (INDEPENDENT_AMBULATORY_CARE_PROVIDER_SITE_OTHER): Payer: Medicare Other

## 2022-05-20 ENCOUNTER — Other Ambulatory Visit (HOSPITAL_COMMUNITY)
Admission: RE | Admit: 2022-05-20 | Discharge: 2022-05-20 | Disposition: A | Discharge status: Home / Self Care | Payer: Medicare Other | Source: Ambulatory Visit | Attending: Internal Medicine | Admitting: Internal Medicine

## 2022-05-20 VITALS — BP 108/68 | HR 66

## 2022-05-20 DIAGNOSIS — I4821 Permanent atrial fibrillation: Secondary | ICD-10-CM

## 2022-05-20 DIAGNOSIS — Z79899 Other long term (current) drug therapy: Secondary | ICD-10-CM | POA: Diagnosis present

## 2022-05-20 LAB — COMPREHENSIVE METABOLIC PANEL
ALT: 21 U/L (ref 0–44)
AST: 23 U/L (ref 15–41)
Albumin: 4.5 g/dL (ref 3.5–5.0)
Alkaline Phosphatase: 64 U/L (ref 38–126)
Anion gap: 7 (ref 5–15)
BUN: 21 mg/dL (ref 8–23)
CO2: 28 mmol/L (ref 22–32)
Calcium: 9.6 mg/dL (ref 8.9–10.3)
Chloride: 104 mmol/L (ref 98–111)
Creatinine, Ser: 1.07 mg/dL (ref 0.61–1.24)
GFR, Estimated: >60 mL/min (ref >60)
Glucose, Bld: 113 mg/dL — ABNORMAL HIGH (ref 70–99)
Potassium: 4.1 mmol/L (ref 3.5–5.1)
Sodium: 139 mmol/L (ref 135–145)
Total Bilirubin: 0.7 mg/dL (ref 0.3–1.2)
Total Protein: 8.4 g/dL — ABNORMAL HIGH (ref 6.5–8.1)

## 2022-05-20 LAB — CBC
HCT: 43 % (ref 39.0–52.0)
Hemoglobin: 14.4 g/dL (ref 13.0–17.0)
MCH: 28.9 pg (ref 26.0–34.0)
MCHC: 33.5 g/dL (ref 30.0–36.0)
MCV: 86.2 fL (ref 80.0–100.0)
Platelets: 223 10*3/uL (ref 150–400)
RBC: 4.99 MIL/uL (ref 4.22–5.81)
RDW: 13.1 % (ref 11.5–15.5)
WBC: 7.1 10*3/uL (ref 4.0–10.5)
nRBC: 0 % (ref 0.0–0.2)

## 2022-05-20 NOTE — Patient Instructions (Signed)
Take all medications as prescribed.  Follow up as directed.

## 2022-05-20 NOTE — Progress Notes (Signed)
Nurse visit today   EKG A-fib, HR 66   He is taking all medications as prescribed.

## 2022-06-11 ENCOUNTER — Other Ambulatory Visit: Payer: Self-pay

## 2022-06-11 ENCOUNTER — Emergency Department (HOSPITAL_COMMUNITY): Payer: 59

## 2022-06-11 ENCOUNTER — Encounter (HOSPITAL_COMMUNITY): Payer: Self-pay

## 2022-06-11 ENCOUNTER — Emergency Department (HOSPITAL_COMMUNITY)
Admission: EM | Admit: 2022-06-11 | Discharge: 2022-06-11 | Disposition: A | Discharge status: Home / Self Care | Payer: 59 | Attending: Emergency Medicine | Admitting: Emergency Medicine

## 2022-06-11 DIAGNOSIS — R0789 Other chest pain: Secondary | ICD-10-CM | POA: Diagnosis not present

## 2022-06-11 DIAGNOSIS — Z7901 Long term (current) use of anticoagulants: Secondary | ICD-10-CM | POA: Diagnosis not present

## 2022-06-11 DIAGNOSIS — I10 Essential (primary) hypertension: Secondary | ICD-10-CM | POA: Insufficient documentation

## 2022-06-11 DIAGNOSIS — Z79899 Other long term (current) drug therapy: Secondary | ICD-10-CM | POA: Insufficient documentation

## 2022-06-11 DIAGNOSIS — R079 Chest pain, unspecified: Secondary | ICD-10-CM | POA: Diagnosis present

## 2022-06-11 LAB — CBC WITH DIFFERENTIAL/PLATELET
Abs Immature Granulocytes: 0.02 10*3/uL (ref 0.00–0.07)
Basophils Absolute: 0 10*3/uL (ref 0.0–0.1)
Basophils Relative: 0 %
Eosinophils Absolute: 0.2 10*3/uL (ref 0.0–0.5)
Eosinophils Relative: 2 %
HCT: 40.6 % (ref 39.0–52.0)
Hemoglobin: 13.6 g/dL (ref 13.0–17.0)
Immature Granulocytes: 0 %
Lymphocytes Relative: 25 %
Lymphs Abs: 1.8 10*3/uL (ref 0.7–4.0)
MCH: 28.9 pg (ref 26.0–34.0)
MCHC: 33.5 g/dL (ref 30.0–36.0)
MCV: 86.4 fL (ref 80.0–100.0)
Monocytes Absolute: 0.5 10*3/uL (ref 0.1–1.0)
Monocytes Relative: 7 %
Neutro Abs: 4.9 10*3/uL (ref 1.7–7.7)
Neutrophils Relative %: 66 %
Platelets: 154 10*3/uL (ref 150–400)
RBC: 4.7 MIL/uL (ref 4.22–5.81)
RDW: 13.2 % (ref 11.5–15.5)
WBC: 7.4 10*3/uL (ref 4.0–10.5)
nRBC: 0 % (ref 0.0–0.2)

## 2022-06-11 LAB — BASIC METABOLIC PANEL
Anion gap: 11 (ref 5–15)
BUN: 21 mg/dL (ref 8–23)
CO2: 28 mmol/L (ref 22–32)
Calcium: 9.1 mg/dL (ref 8.9–10.3)
Chloride: 100 mmol/L (ref 98–111)
Creatinine, Ser: 0.81 mg/dL (ref 0.61–1.24)
GFR, Estimated: >60 mL/min (ref >60)
Glucose, Bld: 146 mg/dL — ABNORMAL HIGH (ref 70–99)
Potassium: 3.3 mmol/L — ABNORMAL LOW (ref 3.5–5.1)
Sodium: 139 mmol/L (ref 135–145)

## 2022-06-11 LAB — TROPONIN I (HIGH SENSITIVITY)
Troponin I (High Sensitivity): 3 ng/L (ref <18)
Troponin I (High Sensitivity): 4 ng/L (ref <18)

## 2022-06-11 NOTE — ED Triage Notes (Addendum)
Pt presents with sharp chest pain and irregular heart beat that started at 1pm tonight. Endorses headache, SOB and dizziness. Pt on blood thinner and has history of Afib.

## 2022-06-11 NOTE — ED Provider Notes (Signed)
[facility] EMERGENCY DEPARTMENT Provider Note   CSN: [id number] Arrival date & time: 06/11/22  6073     History  Chief Complaint  Patient presents with   Chest Pain    Patrick Herrera is a 67 y.o. male.  HPI    This is a 67 year old male with a history of atrial fibrillation on Eliquis who presents with chest discomfort.  Patient reports that this morning he had pressure-like left-sided chest discomfort that was nonradiating.  He states he will frequently get palpitations but this felt different.  Denies shortness of breath or sweating.  Symptoms are nonexertional.  Reports compliance with his medications.  Denies any history of coronary artery disease and "all my blood work recently has been great."  He had a stress test in November 2022 that was low risk. Home Medications Prior to Admission medications   Medication Sig Start Date End Date Taking? Authorizing Provider  ELIQUIS 5 MG TABS tablet Take 1 tablet (5 mg total) by mouth 2 (two) times daily. 05/06/22   Strader, Lennart Pall, PA-C  esomeprazole (NEXIUM) 40 MG capsule Take 40 mg by mouth daily at 12 noon.    [provider]  hydrochlorothiazide (MICROZIDE) 12.5 MG capsule Take 1 capsule (12.5 mg total) by mouth every morning. 05/06/22   Strader, Lennart Pall, PA-C  lisinopril (ZESTRIL) 5 MG tablet Take 1 tablet (5 mg total) by mouth daily. 05/06/22   Strader, Lennart Pall, PA-C  propranolol ER (INDERAL LA) 160 MG SR capsule Take 1 capsule (160 mg total) by mouth daily. 05/06/22   Strader, Lennart Pall, PA-C  rosuvastatin (CRESTOR) 10 MG tablet Take 1 tablet (10 mg total) by mouth daily. 10/27/21   Pricilla Riffle, MD      Allergies    Fentanyl, Oxycontin [oxycodone hcl], and Penicillins    Review of Systems   Review of Systems  Respiratory:  Negative for shortness of breath.   Cardiovascular:  Positive for chest pain. Negative for palpitations.  All other systems reviewed and are negative.   Physical Exam Updated Vital  Signs BP 132/73   Pulse (!) 42   Temp 98.5 F (36.9 C) (Oral)   Resp 13   Ht 1.727 m (5\' 8" )   Wt 80.3 kg   SpO2 100%   BMI 26.91 kg/m  Physical Exam Vitals and nursing note reviewed.  Constitutional:      Appearance: He is well-developed. He is not ill-appearing.  HENT:     Head: Normocephalic and atraumatic.  Eyes:     Pupils: Pupils are equal, round, and reactive to light.  Cardiovascular:     Rate and Rhythm: Normal rate. Rhythm irregular.     Heart sounds: Normal heart sounds. No murmur heard. Pulmonary:     Effort: Pulmonary effort is normal. No respiratory distress.     Breath sounds: Normal breath sounds. No wheezing.  Abdominal:     General: Bowel sounds are normal.     Palpations: Abdomen is soft.     Tenderness: There is no abdominal tenderness. There is no rebound.  Musculoskeletal:     Cervical back: Neck supple.  Lymphadenopathy:     Cervical: No cervical adenopathy.  Skin:    General: Skin is warm and dry.  Neurological:     General: No focal deficit present.     Mental Status: He is alert and oriented to person, place, and time.  Psychiatric:        Mood and Affect: Mood normal.  ED Results / Procedures / Treatments   Labs (all labs ordered are listed, but only abnormal results are displayed) Labs Reviewed  BASIC METABOLIC PANEL - Abnormal; Notable for the following components:      Result Value   Potassium 3.3 (*)    Glucose, Bld 146 (*)    All other components within normal limits  CBC WITH DIFFERENTIAL/PLATELET  TROPONIN I (HIGH SENSITIVITY)  TROPONIN I (HIGH SENSITIVITY)    EKG EKG Interpretation  Date/Time:  Saturday June 11 2022 04:22:05 EST Ventricular Rate:  71 PR Interval:    QRS Duration: 83 QT Interval:  387 QTC Calculation: 421 R Axis:   77 Text Interpretation: Atrial fibrillation Low voltage, precordial leads Confirmed by Ross Marcus ([id number]) on 06/11/2022 6:47:59 AM  Radiology DG Chest Portable 1  View  Result Date: 06/11/2022 CLINICAL DATA:  Chest pain, palpitations. EXAM: PORTABLE CHEST 1 VIEW COMPARISON:  03/30/2021. FINDINGS: The heart size and mediastinal contours are within normal limits. No consolidation, effusion, or pneumothorax. Cervical spinal fusion hardware is noted. Arthroplasty changes are noted at the right shoulder. IMPRESSION: No active disease. Electronically Signed   By: Thornell Sartorius M.D.   On: 06/11/2022 04:40    Procedures Procedures    Medications Ordered in ED Medications - No data to display  ED Course/ Medical Decision Making/ A&P                           Medical Decision Making Amount and/or Complexity of Data Reviewed Labs: ordered. Radiology: ordered.   This patient presents to the ED for concern of chest pain, this involves an extensive number of treatment options, and is a complaint that carries with it a high risk of complications and morbidity.  I considered the following differential and admission for this acute, potentially life threatening condition.  The differential diagnosis includes ACS, PE, pneumothorax, pneumonia  MDM:    This is a 67 year old male who presents with chest pain.  He is overall nontoxic-appearing and vital signs are reassuring.  He is in slow A-fib.  He is in chronic A-fib.  He reports compliance with medications.  He had a stress test 18 months ago that was low risk.  EKG shows no evidence of acute ischemia.  Initial troponin negative.  Basic lab work reassuring.  Given onset of symptoms at 1 AM, recommend repeat Trop.  Unfortunately, patient stated that he had to leave prior to repeat troponin.  This was drawn.  Patient left AGAINST MEDICAL ADVICE.  He has a primary cardiologist who he was encouraged to follow-up with.  At this time he does not appear to be having an acute coronary event.  Chest x-ray without pneumonia or pneumothorax.  Doubt PE.  (Labs, imaging, consults)  Labs: I Ordered, and personally interpreted  labs.  The pertinent results include: CBC, BMP, troponin  Imaging Studies ordered: I ordered imaging studies including chest x-ray I independently visualized and interpreted imaging. I agree with the radiologist interpretation  Additional history obtained from chart review.  External records from outside source obtained and reviewed including prior evaluations  Cardiac Monitoring: The patient was maintained on a cardiac monitor.  I personally viewed and interpreted the cardiac monitored which showed an underlying rhythm of: Atrial fibrillation  Reevaluation: After the interventions noted above, I reevaluated the patient and found that they have :stayed the same  Social Determinants of Health:  lives independently  Disposition: Discharge  Co morbidities that complicate  the patient evaluation  Past Medical History:  Diagnosis Date   Atrial fibrillation (HCC)    Chronic pain    GERD (gastroesophageal reflux disease)    Hyperlipidemia    Hypertension    Kidney stones    Pre-diabetes      Medicines No orders of the defined types were placed in this encounter.   I have reviewed the patients home medicines and have made adjustments as needed  Problem List / ED Course: Problem List Items Addressed This Visit   None Visit Diagnoses     Atypical chest pain    -  Primary                   Final Clinical Impression(s) / ED Diagnoses Final diagnoses:  Atypical chest pain    Rx / DC Orders ED Discharge Orders     None         Merryl Hacker, MD 06/11/22 [phone]

## 2022-08-02 ENCOUNTER — Telehealth: Payer: Self-pay | Admitting: Internal Medicine

## 2022-08-02 DIAGNOSIS — I10 Essential (primary) hypertension: Secondary | ICD-10-CM

## 2022-08-02 MED ORDER — HYDROCHLOROTHIAZIDE 12.5 MG PO CAPS: 12.5000 mg | ORAL_CAPSULE | Freq: Every morning | ORAL | 3 refills | Status: DC

## 2022-08-02 NOTE — Telephone Encounter (Signed)
Refill completed and sent to [facility] in [place] per patient's request.

## 2022-08-02 NOTE — Telephone Encounter (Signed)
Pt came in office and stated that his hydrochlorothiazide (MICROZIDE) 12.5 MG capsule has no more refills. His pharmacy is [facility].

## 2022-11-02 ENCOUNTER — Ambulatory Visit: Payer: 59 | Admitting: Student

## 2022-11-09 NOTE — Progress Notes (Deleted)
History of Present Illness: Patrick Herrera is a 67 y.o. male who presents today as a new patient at [facility].  He reports chief complaint of ***gross hematuria. He reports that the visible hematuria was first noticed *** ago and {gen continuous/intermittent/once:313061} (***still ongoing ***?). He {Actions; denies-reports:120008} prior history of gross hematuria.   Today He {Actions; denies-reports:120008} urinary urgency, frequency, dysuria, straining to void, or sensations of incomplete emptying. He {Actions; denies-reports:120008} ***abdominal or ***flank pain. He {Actions; denies-reports:120008} fevers.  He {Actions; denies-reports:120008} history of kidney stones.  He {Actions; denies-reports:120008} history of pyelonephritis.  He {Actions; denies-reports:120008} history of recent or recurrent UTI. He {Actions; denies-reports:120008} history of GU malignancy or pelvic radiation.  He {Actions; denies-reports:120008} history of autoimmune disease. He {Actions; denies-reports:120008} history of smoking (quit***; smoked*** ppd x*** years). He {Actions; denies-reports:120008} known occupational risks. He {Actions; denies-reports:120008} recent vigorous exercise which they think may be contributory to hematuria. He {Actions; denies-reports:120008} any recent trauma or prolonged pressure to the perineal area. He {Actions; denies-reports:120008} recent illness. He {Actions; denies-reports:120008} taking anticoagulants (***).  Fall Screening: Do you usually have a device to assist in your mobility? {yes/no:20286} ***cane / ***walker / ***wheelchair  Medications: Current Outpatient Medications  Medication Sig Dispense Refill   ELIQUIS 5 MG TABS tablet Take 1 tablet (5 mg total) by mouth 2 (two) times daily. 180 tablet 3   esomeprazole (NEXIUM) 40 MG capsule Take 40 mg by mouth daily at 12 noon.     hydrochlorothiazide (MICROZIDE) 12.5 MG capsule Take 1 capsule (12.5 mg  total) by mouth every morning. 90 capsule 3   lisinopril (ZESTRIL) 5 MG tablet Take 1 tablet (5 mg total) by mouth daily. 90 tablet 3   propranolol ER (INDERAL LA) 160 MG SR capsule Take 1 capsule (160 mg total) by mouth daily. 90 capsule 3   rosuvastatin (CRESTOR) 10 MG tablet Take 1 tablet (10 mg total) by mouth daily. 90 tablet 3   No current facility-administered medications for this visit.    Allergies: Allergies  Allergen Reactions   Fentanyl Nausea And Vomiting   Oxycontin [Oxycodone Hcl] Nausea And Vomiting   Penicillins Nausea And Vomiting    Has patient had a PCN reaction causing immediate rash, facial/tongue/throat swelling, SOB or lightheadedness with hypotension: no Has patient had a PCN reaction causing severe rash involving mucus membranes or skin necrosis: no No Has patient had a PCN reaction that required hospitalization: No Has patient had a PCN reaction occurring within the last 10 years: No If all of the above answers are "NO", then may proceed with Cephalosporin use.     Past Medical History:  Diagnosis Date   Atrial fibrillation (HCC)    Chronic pain    GERD (gastroesophageal reflux disease)    Hyperlipidemia    Hypertension    Kidney stones    Pre-diabetes    Past Surgical History:  Procedure Laterality Date   bil knee surgery     ELBOW SURGERY     NECK SURGERY     x4   REVERSE SHOULDER ARTHROPLASTY Right 09/30/2021   Procedure: REVERSE SHOULDER ARTHROPLASTY;  Surgeon: Bjorn Pippin, MD;  Location: [facility];  Service: Orthopedics;  Laterality: Right;   SHOULDER SURGERY     x4    Family History  Problem Relation Age of Onset   Heart attack Mother    Social History   Socioeconomic History   Marital status: Divorced    Spouse name: Not on  file   Number of children: Not on file   Years of education: Not on file   Highest education level: Not on file  Occupational History   Not on file  Tobacco Use   Smoking status: Never    Smokeless tobacco: Never  Vaping Use   Vaping Use: Never used  Substance and Sexual Activity   Alcohol use: Yes    Comment: social    Drug use: Yes    Types: Marijuana, Oxycodone    Comment: last smoked 09-19-21, oxy daily for chronic pain from pain clinic   Sexual activity: Not Currently  Other Topics Concern   Not on file  Social History Narrative   Not on file   Social Determinants of Health   Financial Resource Strain: Not on file  Food Insecurity: Not on file  Transportation Needs: Not on file  Physical Activity: Not on file  Stress: Not on file  Social Connections: Not on file  Intimate Partner Violence: Not on file    SUBJECTIVE  Review of Systems Constitutional: Patient ***denies any unintentional weight loss or change in strength lntegumentary: Patient ***denies any rashes or pruritus Eyes: Patient denies ***dry eyes ENT: Patient ***denies dry mouth Cardiovascular: Patient ***denies chest pain or syncope Respiratory: Patient ***denies shortness of breath Gastrointestinal: Patient ***denies nausea, vomiting, constipation, or diarrhea Musculoskeletal: Patient ***denies muscle cramps or weakness Neurologic: Patient ***denies convulsions or seizures Psychiatric: Patient ***denies memory problems Allergic/Immunologic: Patient ***denies recent allergic reaction(s) Hematologic/Lymphatic: Patient denies bleeding tendencies Endocrine: Patient ***denies heat/cold intolerance  GU: As per HPI.  OBJECTIVE There were no vitals filed for this visit. There is no height or weight on file to calculate BMI.  Physical Examination  Constitutional: ***No obvious distress; patient is ***non-toxic appearing  Cardiovascular: ***No visible lower extremity edema.  Respiratory: The patient does ***not have audible wheezing/stridor; respirations do ***not appear labored  Gastrointestinal: Abdomen ***non-distended Musculoskeletal: ***Normal ROM of UEs  Skin: ***No obvious rashes/open  sores  Neurologic: CN 2-12 grossly ***intact Psychiatric: Answered questions ***appropriately with ***normal affect  Hematologic/Lymphatic/Immunologic: ***No obvious bruises or sites of spontaneous bleeding  UA: {Desc; negative/positive:13464} *** WBC/hpf, *** RBC/hpf, bacteria (***) *** nitrites, *** leukocytes, *** blood PVR: *** ml  ASSESSMENT No diagnosis found.  For management of gross hematuria, we discussed possible etiologies including but not limited to: vigorous exercise, sexual activity, stone, trauma, blood thinner use, urinary tract infection, kidney function, ***BPH, ***radiation cystitis, malignancy. ***We discussed pt's smoking as a risk factor for GU cancer and encouraged ***continued smoking cessation.***  We discussed the importance of work-up including assessing the upper and lower GU tract with CT urogram and cystoscopy. We will also check ***CMP, ***CBC, and ***voided cytology.  We discussed the risk for clot retention and pt was advised to increase fluid intake to thin out clots. Pt was advised to go to the ER if they become unable to urinate due to clot retention, start having symptoms of anemia (which were discussed), or any other significant concerning acute symptoms.  Pt verbalized understanding and decided to pursue this work-up. Patient agreed to follow-up afterward to discuss the results and formulate a treatment plan based on the findings. All questions were answered.  *** High risk= >50 y/o, male, >25 RBC/HPF, history of gross hematuria /smoking/ GU diseases/ GU radiation / occupational exposure/ chronic indwelling foreign body (cath)  *** Likelihood of finding a clinically significant lesion = 13.8% with gross hematuria versus 3.1% with microscopic hematuria  *** If RBC casts or proteinuria  seen on UA, consider nephrology referral to evaluate for glomerular disease  PLAN Advised the following: 1. *** 2. ***No follow-ups on file.  No orders of the  defined types were placed in this encounter.   It has been explained that the patient is to follow regularly with their PCP in addition to all other providers involved in their care and to follow instructions provided by these respective offices. Patient advised to contact urology clinic if any urologic-pertaining questions, concerns, new symptoms or problems arise in the interim period.  There are no Patient Instructions on file for this visit.  Electronically signed by:  Patrick Falls, MSN, FNP-C, CUNP 11/09/2022 1:50 PM

## 2022-11-10 ENCOUNTER — Ambulatory Visit: Payer: 59 | Admitting: Urology

## 2022-11-10 DIAGNOSIS — R319 Hematuria, unspecified: Secondary | ICD-10-CM

## 2022-12-02 ENCOUNTER — Ambulatory Visit: Payer: 59 | Attending: Student | Admitting: Student

## 2022-12-02 NOTE — Progress Notes (Deleted)
Cardiology Office Note    Date:  12/02/2022  ID:  Patrick Herrera, DOB [birth year], MRN [id number] Cardiologist: Dietrich Pates, MD    History of Present Illness:    Patrick Herrera is a 67 y.o. male with past medical history of permanent atrial fibrillation, HTN, HLD and type II DM who presents to the office today for 27-month follow-up.  He was examined by myself in 05/2022 and had lost additional 20 pounds since his last office visit and had also stopped taking pain medication regularly.  He denied any recent anginal symptoms.  He had been off Propranolol due to not having refills of the medication and given his heart rate in the 120's, he was restarted on Propranolol ER 160 mg daily and continued on Eliquis for anticoagulation.  At the time of his follow-up nurse visit, his heart rate had improved into the 60's.  ROS: ***  Studies Reviewed:   EKG: EKG is*** ordered today and demonstrates ***  NST: 04/2021   Findings are equivocal. The study is low risk.   No ST deviation was noted. Atrial fibrillation present throughout with heart rate increasing to the 140s following Lexiscan. The ECG was negative for ischemia.   LV perfusion is equivocal.  Small, mild intensity, partially reversible apical inferolateral defect with evidence of diaphragmatic attenuation as well.  Cannot exclude mild ischemic territory in this distribution   Left ventricular function is normal. Nuclear stress EF: 69 %.   Low risk study with possible mild apical inferolateral ischemic territory in the setting of diaphragmatic attenuation, LVEF 69%.   Echocardiogram: 04/2021 IMPRESSIONS     1. Left ventricular ejection fraction, by estimation, is 65 to 70%. The  left ventricle has normal function. The left ventricle has no regional  wall motion abnormalities. Left ventricular diastolic parameters are  indeterminate.   2. Right ventricular systolic function is normal. The right ventricular  size is normal. Tricuspid  regurgitation signal is inadequate for assessing  PA pressure.   3. Left atrial size was upper normal.   4. Right atrial size was upper normal.   5. The mitral valve is grossly normal. Mild mitral valve regurgitation.   6. The aortic valve is tricuspid. Aortic valve regurgitation is not  visualized.   7. The inferior vena cava is normal in size with greater than 50%  respiratory variability, suggesting right atrial pressure of 3 mmHg.   Comparison(s): No prior Echocardiogram.   Event Monitor: 04/2021 Patch Wear Time:  2 days and 0 hours (2022-10-24T14:46:45-0400 to 2022-10-26T15:06:59-0400)   Atrial Fibrillation occurred continuously (100% burden), ranging from 48-175 bpm (avg of 77 bpm). No Isolated VEs, VE Couplets, or VE Triplets were present.   Risk Assessment/Calculations:   {Does this patient have ATRIAL FIBRILLATION?[phone]} No BP recorded.  {Refresh Note OR Click here to enter BP  :1}***         Physical Exam:   VS:  There were no vitals taken for this visit.   Wt Readings from Last 3 Encounters:  06/11/22 177 lb (80.3 kg)  05/06/22 178 lb 12.8 oz (81.1 kg)  09/30/21 199 lb 8.3 oz (90.5 kg)     GEN: Well nourished, well developed in no acute distress NECK: No JVD; No carotid bruits CARDIAC: ***RRR, no murmurs, rubs, gallops RESPIRATORY:  Clear to auscultation without rales, wheezing or rhonchi  ABDOMEN: Appears non-distended. No obvious abdominal masses. EXTREMITIES: No clubbing or cyanosis. No edema.  Distal pedal pulses are 2+ bilaterally.   Assessment  and Plan:   1. Permanent Atrial Fibrillation/Use of Long-term Anticoagulation - ***  2. HTN - ***  3. HLD - ****  Signed, Ellsworth Lennox, PA-C

## 2022-12-05 ENCOUNTER — Encounter: Payer: Self-pay | Admitting: Student

## 2023-01-30 NOTE — Progress Notes (Deleted)
  Cardiology Office Note:  .   Date:  01/30/2023  ID:  Patrick Herrera, DOB 1956-02-24, MRN [id number] PCP: Patrick Aldo, NP  Patrick Herrera Providers Cardiologist:  Patrick Pates, MD { Click to update primary MD,subspecialty MD or APP then REFRESH:1}   History of Present Illness: Patrick Herrera Kitchen   Patrick Herrera is a 67 y.o. male history of permanent atrial fibrillation, HTN, HLD and Type II DM  ROS: ***  Studies Reviewed: Patrick Herrera Kitchen         Prior CV Studies: {Select studies to display:26339}   NST: 04/14/2021   Findings are equivocal. The study is low risk.   No ST deviation was noted. Atrial fibrillation present throughout with heart rate increasing to the 140s following Lexiscan. The ECG was negative for ischemia.   LV perfusion is equivocal.  Small, mild intensity, partially reversible apical inferolateral defect with evidence of diaphragmatic attenuation as well.  Cannot exclude mild ischemic territory in this distribution   Left ventricular function is normal. Nuclear stress EF: 69 %.   Low risk study with possible mild apical inferolateral ischemic territory in the setting of diaphragmatic attenuation, LVEF 69%.   Echocardiogram: 04-14-21 IMPRESSIONS     1. Left ventricular ejection fraction, by estimation, is 65 to 70%. The  left ventricle has normal function. The left ventricle has no regional  wall motion abnormalities. Left ventricular diastolic parameters are  indeterminate.   2. Right ventricular systolic function is normal. The right ventricular  size is normal. Tricuspid regurgitation signal is inadequate for assessing  PA pressure.   3. Left atrial size was upper normal.   4. Right atrial size was upper normal.   5. The mitral valve is grossly normal. Mild mitral valve regurgitation.   6. The aortic valve is tricuspid. Aortic valve regurgitation is not  visualized.   7. The inferior vena cava is normal in size with greater than 50%  respiratory variability, suggesting right atrial  pressure of 3 mmHg.   Comparison(s): No prior Echocardiogram.    Event Monitor: 04-14-21 Patch Wear Time:  2 days and 0 hours (2022-10-24T14:46:45-0400 to 2022-10-26T15:06:59-0400)   Atrial Fibrillation occurred continuously (100% burden), ranging from 48-175 bpm (avg of 77 bpm). No Isolated VEs, VE Couplets, or VE Triplets were present.   Risk Assessment/Calculations:   {Does this patient have ATRIAL FIBRILLATION?:[phone]} No BP recorded.  {Refresh Note OR Click here to enter BP  :1}***       Physical Exam:   VS:  There were no vitals taken for this visit.   Wt Readings from Last 3 Encounters:  06/11/22 177 lb (80.3 kg)  05/06/22 178 lb 12.8 oz (81.1 kg)  09/30/21 199 lb 8.3 oz (90.5 kg)    GEN: Well nourished, well developed in no acute distress NECK: No JVD; No carotid bruits CARDIAC: ***RRR, no murmurs, rubs, gallops RESPIRATORY:  Clear to auscultation without rales, wheezing or rhonchi  ABDOMEN: Soft, non-tender, non-distended EXTREMITIES:  No edema; No deformity   ASSESSMENT AND PLAN: .   Permanent Afib on eliquis and propranolol  HTN  HLD     {Are you ordering a CV Procedure (e.g. stress test, cath, DCCV, TEE, etc)?   Press F2        :[id number]}  Dispo: ***  Signed, [name], PA-C

## 2023-02-13 ENCOUNTER — Ambulatory Visit: Payer: 59 | Admitting: Physician Assistant

## 2023-03-20 NOTE — Progress Notes (Signed)
Cardiology Office Note:  .   Date:  03/28/2023  ID:  Patrick Herrera, DOB 09/08/1955, MRN [id number] PCP: Patrick Aldo, NP  [facility] Providers Cardiologist:  Dietrich Pates, MD    History of Present Illness: Patrick Herrera   Patrick Herrera is a 67 y.o. male history of permanent atrial fibrillation, HTN, HLD and Type II DM  Patient saw Ms. Strader PA-C 05/2022 and had run out of propranolol and HR 120's. This was restarted.   Patient comes in for f/u. Under a lot of stress as he is going to court for his landlord stealing his money and trying to evict him. He's had no heating or air for 6 yrs and it was 107 degrees.  He occasionally feels a stabbing in his chest in the heat eased with Xanax. Last time 6 months ago. He is asking for a letter to take to court saying it could affect his heart-I asked him to f/u with Patrick Herrera for any letter as this is my first time seeing him. Recently had kidney stones. No dyspnea, palpitations, chest tightness, edema.  ROS:    Studies Reviewed: Patrick Herrera         Prior CV Studies:   NST: 04/2021   Findings are equivocal. The study is low risk.   No ST deviation was noted. Atrial fibrillation present throughout with heart rate increasing to the 140s following Lexiscan. The ECG was negative for ischemia.   LV perfusion is equivocal.  Small, mild intensity, partially reversible apical inferolateral defect with evidence of diaphragmatic attenuation as well.  Cannot exclude mild ischemic territory in this distribution   Left ventricular function is normal. Nuclear stress EF: 69 %.   Low risk study with possible mild apical inferolateral ischemic territory in the setting of diaphragmatic attenuation, LVEF 69%.   Echocardiogram: 04/2021 IMPRESSIONS     1. Left ventricular ejection fraction, by estimation, is 65 to 70%. The  left ventricle has normal function. The left ventricle has no regional  wall motion abnormalities. Left ventricular diastolic parameters are   indeterminate.   2. Right ventricular systolic function is normal. The right ventricular  size is normal. Tricuspid regurgitation signal is inadequate for assessing  PA pressure.   3. Left atrial size was upper normal.   4. Right atrial size was upper normal.   5. The mitral valve is grossly normal. Mild mitral valve regurgitation.   6. The aortic valve is tricuspid. Aortic valve regurgitation is not  visualized.   7. The inferior vena cava is normal in size with greater than 50%  respiratory variability, suggesting right atrial pressure of 3 mmHg.   Comparison(s): No prior Echocardiogram.    Event Monitor: 04/2021 Patch Wear Time:  2 days and 0 hours (2022-10-24T14:46:45-0400 to 2022-10-26T15:06:59-0400)   Atrial Fibrillation occurred continuously (100% burden), ranging from 48-175 bpm (avg of 77 bpm). No Isolated VEs, VE Couplets, or VE Triplets were present.   Risk Assessment/Calculations:    CHA2DS2-VASc Score = 3   This indicates a 3.2% annual risk of stroke. The patient's score is based upon: CHF History: 0 HTN History: 1 Diabetes History: 1 Stroke History: 0 Vascular Disease History: 0 Age Score: 1 Gender Score: 0            Physical Exam:   VS:  BP 124/72   Pulse 70   Ht 5\' 7"  (1.702 m)   Wt 176 lb (79.8 kg)   SpO2 100%   BMI 27.57 kg/m  Wt Readings from Last 3 Encounters:  03/28/23 176 lb (79.8 kg)  06/11/22 177 lb (80.3 kg)  05/06/22 178 lb 12.8 oz (81.1 kg)    GEN: Well nourished, well developed in no acute distress NECK: No JVD; No carotid bruits CARDIAC:  irreg irreg, no murmurs, rubs, gallops RESPIRATORY:  Clear to auscultation without rales, wheezing or rhonchi  ABDOMEN: Soft, non-tender, non-distended EXTREMITIES:  No edema; No deformity   ASSESSMENT AND PLAN: .     Permanent Atrial Fibrillation - controlled on propranolol 160 mg daily - Continue Eliquis 5mg  BID for anticoagulation.  Recheck CBC and BMET today   HTN - BP well  controlled on hydrochlorothiazide, lisinopril, propranolol.     HLD - LDL 53 09/2021. He remains on Crestor 10mg  daily.           Dispo: f/u in 6 months.  Signed, [name], PA-C

## 2023-03-28 ENCOUNTER — Other Ambulatory Visit (HOSPITAL_COMMUNITY)
Admission: RE | Admit: 2023-03-28 | Discharge: 2023-03-28 | Disposition: A | Discharge status: Home / Self Care | Payer: 59 | Source: Ambulatory Visit | Attending: Physician Assistant | Admitting: Physician Assistant

## 2023-03-28 ENCOUNTER — Ambulatory Visit: Payer: 59 | Attending: Physician Assistant | Admitting: Physician Assistant

## 2023-03-28 ENCOUNTER — Encounter: Payer: Self-pay | Admitting: Physician Assistant

## 2023-03-28 VITALS — BP 124/72 | HR 70 | Ht 67.0 in | Wt 176.0 lb

## 2023-03-28 DIAGNOSIS — E782 Mixed hyperlipidemia: Secondary | ICD-10-CM

## 2023-03-28 DIAGNOSIS — I4821 Permanent atrial fibrillation: Secondary | ICD-10-CM | POA: Diagnosis present

## 2023-03-28 DIAGNOSIS — I10 Essential (primary) hypertension: Secondary | ICD-10-CM

## 2023-03-28 LAB — CBC
HCT: 36.8 % — ABNORMAL LOW (ref 39.0–52.0)
Hemoglobin: 12 g/dL — ABNORMAL LOW (ref 13.0–17.0)
MCH: 28.9 pg (ref 26.0–34.0)
MCHC: 32.6 g/dL (ref 30.0–36.0)
MCV: 88.7 fL (ref 80.0–100.0)
Platelets: 166 10*3/uL (ref 150–400)
RBC: 4.15 MIL/uL — ABNORMAL LOW (ref 4.22–5.81)
RDW: 14 % (ref 11.5–15.5)
WBC: 5.2 10*3/uL (ref 4.0–10.5)
nRBC: 0 % (ref 0.0–0.2)

## 2023-03-28 LAB — BASIC METABOLIC PANEL
Anion gap: 8 (ref 5–15)
BUN: 24 mg/dL — ABNORMAL HIGH (ref 8–23)
CO2: 27 mmol/L (ref 22–32)
Calcium: 9.2 mg/dL (ref 8.9–10.3)
Chloride: 101 mmol/L (ref 98–111)
Creatinine, Ser: 0.93 mg/dL (ref 0.61–1.24)
GFR, Estimated: >60 mL/min (ref >60)
Glucose, Bld: 102 mg/dL — ABNORMAL HIGH (ref 70–99)
Potassium: 4.1 mmol/L (ref 3.5–5.1)
Sodium: 136 mmol/L (ref 135–145)

## 2023-03-28 NOTE — Patient Instructions (Signed)
Medication Instructions:   Your physician recommends that you continue on your current medications as directed. Please refer to the Current Medication list given to you today.   Labwork: Cbc,bmet today  Testing/Procedures:  None today  Follow-Up: 6 months  Any Other Special Instructions Will Be Listed Below (If Applicable).  If you need a refill on your cardiac medications before your next appointment, please call your pharmacy.

## 2023-06-14 ENCOUNTER — Encounter: Payer: Self-pay | Admitting: Internal Medicine

## 2023-06-14 ENCOUNTER — Telehealth: Payer: Self-pay | Admitting: *Deleted

## 2023-06-14 ENCOUNTER — Ambulatory Visit: Payer: 59 | Admitting: Internal Medicine

## 2023-06-14 VITALS — BP 117/78 | HR 87 | Temp 97.5°F | Ht 68.0 in | Wt 180.7 lb

## 2023-06-14 DIAGNOSIS — Z1211 Encounter for screening for malignant neoplasm of colon: Secondary | ICD-10-CM

## 2023-06-14 DIAGNOSIS — Z7901 Long term (current) use of anticoagulants: Secondary | ICD-10-CM

## 2023-06-14 NOTE — Patient Instructions (Signed)
 I agree that we need to schedule you for colonoscopy for colon cancer screening purposes.    You will need to hold your Eliquis  x 2 days prior to procedure.  We will reach out to your cardiologist in this to regard.  Once we have heard back from them, we will call you to schedule procedure.  It was very nice meeting you today.  Dr. Cindie

## 2023-06-14 NOTE — Telephone Encounter (Signed)
  Request for patient to stop medication prior to procedure or is needing cleareance  06/14/23  Patrick Herrera 20-Jun-1955  What type of surgery is being performed? COLONOSCOPY  When is surgery scheduled? TBD  What type of clearance is required (medical or pharmacy to hold medication or both? PHARMACY  Are there any medications that need to be held prior to surgery and how long? ELIQUIS  X 2 DAYS  Name of physician performing surgery?  Dr. Carlin Hasty [facility] Phone: [phone] Fax: [phone]  Anethesia type (none, local, MAC, general)? MAC

## 2023-06-14 NOTE — Telephone Encounter (Signed)
 Pharm clearance only, will route to pharm team. Last OV 03/2023 permanent atrial fib without acute cardiac concerns.

## 2023-06-14 NOTE — H&P (View-Only) (Signed)
Primary Care Physician:  Hillery Aldo, NP Primary Gastroenterologist:  Dr. Marletta Lor  Chief Complaint  Patient presents with   New Patient (Initial Visit)    Patient here today and in need of a Tcs, as has history of polyps. He says he has had some issues with unexplained weight loss and is concerned.     HPI:   Patrick Herrera is a 68 y.o. male who presents to the clinic today by referral from his PCP Janee Morn for evaluation for colonoscopy.  States his last colonoscopy was 16 years ago believes he may have had polyps removed.  No family history of colorectal malignancy.  No melena hematochezia.  Does note weight loss of approximately 100 pounds, some of which was intentional.  Eats a healthy diet.  He is concerned about the amount of weight he is lost however.  Denies any upper GI symptoms including heartburn, reflux, dysphagia/odynophagia, epigastric or chest pain.  Does have history of permanent atrial fibrillation, chronically on Eliquis.  Follows with cardiology.  Past Medical History:  Diagnosis Date   Atrial fibrillation (HCC)    Chronic pain    GERD (gastroesophageal reflux disease)    Hyperlipidemia    Hypertension    Kidney stones    Pre-diabetes     Past Surgical History:  Procedure Laterality Date   bil knee surgery     ELBOW SURGERY     NECK SURGERY     x4   REVERSE SHOULDER ARTHROPLASTY Right 09/30/2021   Procedure: REVERSE SHOULDER ARTHROPLASTY;  Surgeon: Bjorn Pippin, MD;  Location: [facility];  Service: Orthopedics;  Laterality: Right;   SHOULDER SURGERY     x4    Current Outpatient Medications  Medication Sig Dispense Refill   ALPRAZolam (XANAX) 0.5 MG tablet Take 0.5 mg by mouth 2 (two) times daily.     dapagliflozin propanediol (FARXIGA) 10 MG TABS tablet Take 10 mg by mouth daily.     ELIQUIS 5 MG TABS tablet Take 1 tablet (5 mg total) by mouth 2 (two) times daily. 180 tablet 3   esomeprazole (NEXIUM) 40 MG capsule Take 40 mg by  mouth daily at 12 noon.     hydrochlorothiazide (MICROZIDE) 12.5 MG capsule Take 1 capsule (12.5 mg total) by mouth every morning. 90 capsule 3   lisinopril (ZESTRIL) 5 MG tablet Take 1 tablet (5 mg total) by mouth daily. 90 tablet 3   oxyCODONE-acetaminophen (PERCOCET) 7.5-325 MG tablet Take 1 tablet by mouth every 8 (eight) hours.     propranolol ER (INDERAL LA) 160 MG SR capsule Take 1 capsule (160 mg total) by mouth daily. 90 capsule 3   rosuvastatin (CRESTOR) 10 MG tablet Take 1 tablet (10 mg total) by mouth daily. 90 tablet 3   tamsulosin (FLOMAX) 0.4 MG CAPS capsule Take 0.4 mg by mouth daily.     No current facility-administered medications for this visit.    Allergies as of 06/14/2023 - Review Complete 06/14/2023  Allergen Reaction Noted   Fentanyl Nausea And Vomiting 11/18/2019   Oxycontin [oxycodone hcl] Nausea And Vomiting 09/30/2021   Penicillins Nausea And Vomiting 05/24/2012    Family History  Problem Relation Age of Onset   Heart attack Mother     Social History   Socioeconomic History   Marital status: Divorced    Spouse name: Not on file   Number of children: Not on file   Years of education: Not on file   Highest education level:  Not on file  Occupational History   Not on file  Tobacco Use   Smoking status: Never   Smokeless tobacco: Never  Vaping Use   Vaping status: Never Used  Substance and Sexual Activity   Alcohol use: Yes    Comment: social    Drug use: Yes    Types: Marijuana, Oxycodone    Comment: last smoked 09-19-21, oxy daily for chronic pain from pain clinic   Sexual activity: Not Currently  Other Topics Concern   Not on file  Social History Narrative   Not on file   Social Drivers of Health   Financial Resource Strain: Not on file  Food Insecurity: Not on file  Transportation Needs: Not on file  Physical Activity: Not on file  Stress: Not on file  Social Connections: Not on file  Intimate Partner Violence: Not on file     Subjective: Review of Systems  Constitutional:  Negative for chills and fever.  HENT:  Negative for congestion and hearing loss.   Eyes:  Negative for blurred vision and double vision.  Respiratory:  Negative for cough and shortness of breath.   Cardiovascular:  Negative for chest pain and palpitations.  Gastrointestinal:  Negative for abdominal pain, blood in stool, constipation, diarrhea, heartburn, melena and vomiting.  Genitourinary:  Negative for dysuria and urgency.  Musculoskeletal:  Negative for joint pain and myalgias.  Skin:  Negative for itching and rash.  Neurological:  Negative for dizziness and headaches.  Psychiatric/Behavioral:  Negative for depression. The patient is not nervous/anxious.        Objective: BP 117/78 (BP Location: Left Arm, Patient Position: Sitting, Cuff Size: Normal)   Pulse 87   Temp (!) 97.5 F (36.4 C) (Temporal)   Ht 5\' 8"  (1.727 m)   Wt 180 lb 11.2 oz (82 kg)   BMI 27.48 kg/m  Physical Exam Constitutional:      Appearance: Normal appearance.  HENT:     Head: Normocephalic and atraumatic.  Eyes:     Extraocular Movements: Extraocular movements intact.     Conjunctiva/sclera: Conjunctivae normal.  Cardiovascular:     Rate and Rhythm: Normal rate and regular rhythm.  Pulmonary:     Effort: Pulmonary effort is normal.     Breath sounds: Normal breath sounds.  Abdominal:     General: Bowel sounds are normal.     Palpations: Abdomen is soft.  Musculoskeletal:        General: Normal range of motion.     Cervical back: Normal range of motion and neck supple.  Skin:    General: Skin is warm.  Neurological:     General: No focal deficit present.     Mental Status: He is alert and oriented to person, place, and time.  Psychiatric:        Mood and Affect: Mood normal.        Behavior: Behavior normal.      Assessment: *Colon cancer screening *Systemic anticoagulation with Eliquis  Plan: Will schedule for screening  colonoscopy.The risks including infection, bleed, or perforation as well as benefits, limitations, alternatives and imponderables have been reviewed with the patient. Questions have been answered. All parties agreeable.  Patient will need to hold his Eliquis x 2 days prior to procedure.  Counseled on increased risk of cardiovascular event including stroke during this time.  We will reach out to his cardiologist for their blessing in this regard.  Appreciate their help immensely.  Thank you Janee Morn for the  kind referral.  06/14/2023 1:49 PM   Disclaimer: This note was dictated with voice recognition software. Similar sounding words can inadvertently be transcribed and may not be corrected upon review.

## 2023-06-14 NOTE — Progress Notes (Signed)
 Primary Care Physician:  Hillery Aldo, NP Primary Gastroenterologist:  Dr. Marletta Lor  Chief Complaint  Patient presents with   New Patient (Initial Visit)    Patient here today and in need of a Tcs, as has history of polyps. He says he has had some issues with unexplained weight loss and is concerned.     HPI:   Patrick Herrera is a 68 y.o. male who presents to the clinic today by referral from his PCP Janee Morn for evaluation for colonoscopy.  States his last colonoscopy was 16 years ago believes he may have had polyps removed.  No family history of colorectal malignancy.  No melena hematochezia.  Does note weight loss of approximately 100 pounds, some of which was intentional.  Eats a healthy diet.  He is concerned about the amount of weight he is lost however.  Denies any upper GI symptoms including heartburn, reflux, dysphagia/odynophagia, epigastric or chest pain.  Does have history of permanent atrial fibrillation, chronically on Eliquis.  Follows with cardiology.  Past Medical History:  Diagnosis Date   Atrial fibrillation (HCC)    Chronic pain    GERD (gastroesophageal reflux disease)    Hyperlipidemia    Hypertension    Kidney stones    Pre-diabetes     Past Surgical History:  Procedure Laterality Date   bil knee surgery     ELBOW SURGERY     NECK SURGERY     x4   REVERSE SHOULDER ARTHROPLASTY Right 09/30/2021   Procedure: REVERSE SHOULDER ARTHROPLASTY;  Surgeon: Bjorn Pippin, MD;  Location: [facility];  Service: Orthopedics;  Laterality: Right;   SHOULDER SURGERY     x4    Current Outpatient Medications  Medication Sig Dispense Refill   ALPRAZolam (XANAX) 0.5 MG tablet Take 0.5 mg by mouth 2 (two) times daily.     dapagliflozin propanediol (FARXIGA) 10 MG TABS tablet Take 10 mg by mouth daily.     ELIQUIS 5 MG TABS tablet Take 1 tablet (5 mg total) by mouth 2 (two) times daily. 180 tablet 3   esomeprazole (NEXIUM) 40 MG capsule Take 40 mg by  mouth daily at 12 noon.     hydrochlorothiazide (MICROZIDE) 12.5 MG capsule Take 1 capsule (12.5 mg total) by mouth every morning. 90 capsule 3   lisinopril (ZESTRIL) 5 MG tablet Take 1 tablet (5 mg total) by mouth daily. 90 tablet 3   oxyCODONE-acetaminophen (PERCOCET) 7.5-325 MG tablet Take 1 tablet by mouth every 8 (eight) hours.     propranolol ER (INDERAL LA) 160 MG SR capsule Take 1 capsule (160 mg total) by mouth daily. 90 capsule 3   rosuvastatin (CRESTOR) 10 MG tablet Take 1 tablet (10 mg total) by mouth daily. 90 tablet 3   tamsulosin (FLOMAX) 0.4 MG CAPS capsule Take 0.4 mg by mouth daily.     No current facility-administered medications for this visit.    Allergies as of 06/14/2023 - Review Complete 06/14/2023  Allergen Reaction Noted   Fentanyl Nausea And Vomiting 11/18/2019   Oxycontin [oxycodone hcl] Nausea And Vomiting 09/30/2021   Penicillins Nausea And Vomiting 05/24/2012    Family History  Problem Relation Age of Onset   Heart attack Mother     Social History   Socioeconomic History   Marital status: Divorced    Spouse name: Not on file   Number of children: Not on file   Years of education: Not on file   Highest education level:  Not on file  Occupational History   Not on file  Tobacco Use   Smoking status: Never   Smokeless tobacco: Never  Vaping Use   Vaping status: Never Used  Substance and Sexual Activity   Alcohol use: Yes    Comment: social    Drug use: Yes    Types: Marijuana, Oxycodone    Comment: last smoked 09-19-21, oxy daily for chronic pain from pain clinic   Sexual activity: Not Currently  Other Topics Concern   Not on file  Social History Narrative   Not on file   Social Drivers of Health   Financial Resource Strain: Not on file  Food Insecurity: Not on file  Transportation Needs: Not on file  Physical Activity: Not on file  Stress: Not on file  Social Connections: Not on file  Intimate Partner Violence: Not on file     Subjective: Review of Systems  Constitutional:  Negative for chills and fever.  HENT:  Negative for congestion and hearing loss.   Eyes:  Negative for blurred vision and double vision.  Respiratory:  Negative for cough and shortness of breath.   Cardiovascular:  Negative for chest pain and palpitations.  Gastrointestinal:  Negative for abdominal pain, blood in stool, constipation, diarrhea, heartburn, melena and vomiting.  Genitourinary:  Negative for dysuria and urgency.  Musculoskeletal:  Negative for joint pain and myalgias.  Skin:  Negative for itching and rash.  Neurological:  Negative for dizziness and headaches.  Psychiatric/Behavioral:  Negative for depression. The patient is not nervous/anxious.        Objective: BP 117/78 (BP Location: Left Arm, Patient Position: Sitting, Cuff Size: Normal)   Pulse 87   Temp (!) 97.5 F (36.4 C) (Temporal)   Ht 5\' 8"  (1.727 m)   Wt 180 lb 11.2 oz (82 kg)   BMI 27.48 kg/m  Physical Exam Constitutional:      Appearance: Normal appearance.  HENT:     Head: Normocephalic and atraumatic.  Eyes:     Extraocular Movements: Extraocular movements intact.     Conjunctiva/sclera: Conjunctivae normal.  Cardiovascular:     Rate and Rhythm: Normal rate and regular rhythm.  Pulmonary:     Effort: Pulmonary effort is normal.     Breath sounds: Normal breath sounds.  Abdominal:     General: Bowel sounds are normal.     Palpations: Abdomen is soft.  Musculoskeletal:        General: Normal range of motion.     Cervical back: Normal range of motion and neck supple.  Skin:    General: Skin is warm.  Neurological:     General: No focal deficit present.     Mental Status: He is alert and oriented to person, place, and time.  Psychiatric:        Mood and Affect: Mood normal.        Behavior: Behavior normal.      Assessment: *Colon cancer screening *Systemic anticoagulation with Eliquis  Plan: Will schedule for screening  colonoscopy.The risks including infection, bleed, or perforation as well as benefits, limitations, alternatives and imponderables have been reviewed with the patient. Questions have been answered. All parties agreeable.  Patient will need to hold his Eliquis x 2 days prior to procedure.  Counseled on increased risk of cardiovascular event including stroke during this time.  We will reach out to his cardiologist for their blessing in this regard.  Appreciate their help immensely.  Thank you Janee Morn for the  kind referral.  06/14/2023 1:49 PM   Disclaimer: This note was dictated with voice recognition software. Similar sounding words can inadvertently be transcribed and may not be corrected upon review.

## 2023-06-17 NOTE — Telephone Encounter (Signed)
 Patient with diagnosis of atrial fibrillation on Eliquis  for anticoagulation.    Procedure: colonoscopy Date of procedure: TBD   CHA2DS2-VASc Score = 3   This indicates a 3.2% annual risk of stroke. The patient's score is based upon: CHF History: 0 HTN History: 1 Diabetes History: 1 Stroke History: 0 Vascular Disease History: 0 Age Score: 1 Gender Score: 0   CrCl 91 Platelet count 166  Per office protocol, patient can hold Eliquis  for 2 days prior to procedure.   Patient will not need bridging with Lovenox (enoxaparin) around procedure.  **This guidance is not considered finalized until pre-operative APP has relayed final recommendations.**

## 2023-06-19 MED ORDER — PEG 3350-KCL-NA BICARB-NACL 420 G PO SOLR: 4000.0000 mL | Freq: Once | ORAL | 0 refills | Status: AC

## 2023-06-19 NOTE — Addendum Note (Signed)
 Addended by: Armstead Peaks on: 06/19/2023 03:55 PM   Modules accepted: Orders

## 2023-06-19 NOTE — Telephone Encounter (Signed)
   Patient Name: Patrick Herrera  DOB: [birth year] MRN: [id number]  Primary Cardiologist: Vina Gull, MD  Chart reviewed as part of pre-operative protocol coverage. Pre-op clearance already addressed by colleagues in earlier phone notes. To summarize recommendations:  He was seen < 2 months ago and was doing well at that visit, no medication changes.   -Per office protocol, patient can hold Eliquis  for 2 days prior to procedure.   Patient will not need bridging with Lovenox (enoxaparin) around procedure.He can resume Eliquis  when medically safe to do so.    Will route this bundled recommendation to requesting provider via Epic fax function and remove from pre-op pool. Please call with questions.  Patrick LOISE Fabry, PA-C 06/19/2023, 8:43 AM

## 2023-06-19 NOTE — Telephone Encounter (Signed)
 Spoke with pt. He has been scheduled for 2/4 for colonoscopy with Dr. Cindie, ASA 2. He is aware to hold farxiga x 3 days prior, eliquis  x 2 days prior. He is also aware to go to [facility] to have blood work done within the next 2 weeks. Will send rx for prep to [facility] for pick and instructions mailed to him.

## 2023-06-29 ENCOUNTER — Emergency Department (HOSPITAL_COMMUNITY): Payer: 59

## 2023-06-29 ENCOUNTER — Emergency Department (HOSPITAL_COMMUNITY)
Admission: EM | Admit: 2023-06-29 | Discharge: 2023-06-30 | Disposition: A | Discharge status: Home / Self Care | Payer: 59 | Attending: Emergency Medicine | Admitting: Emergency Medicine

## 2023-06-29 ENCOUNTER — Encounter (HOSPITAL_COMMUNITY): Payer: Self-pay | Admitting: Emergency Medicine

## 2023-06-29 ENCOUNTER — Other Ambulatory Visit: Payer: Self-pay

## 2023-06-29 DIAGNOSIS — N390 Urinary tract infection, site not specified: Secondary | ICD-10-CM | POA: Insufficient documentation

## 2023-06-29 DIAGNOSIS — Z7901 Long term (current) use of anticoagulants: Secondary | ICD-10-CM | POA: Diagnosis not present

## 2023-06-29 DIAGNOSIS — R42 Dizziness and giddiness: Secondary | ICD-10-CM | POA: Diagnosis present

## 2023-06-29 LAB — COMPREHENSIVE METABOLIC PANEL
ALT: 19 U/L (ref 0–44)
AST: 23 U/L (ref 15–41)
Albumin: 4.4 g/dL (ref 3.5–5.0)
Alkaline Phosphatase: 52 U/L (ref 38–126)
Anion gap: 12 (ref 5–15)
BUN: 26 mg/dL — ABNORMAL HIGH (ref 8–23)
CO2: 24 mmol/L (ref 22–32)
Calcium: 9.7 mg/dL (ref 8.9–10.3)
Chloride: 100 mmol/L (ref 98–111)
Creatinine, Ser: 1.01 mg/dL (ref 0.61–1.24)
GFR, Estimated: >60 mL/min (ref >60)
Glucose, Bld: 115 mg/dL — ABNORMAL HIGH (ref 70–99)
Potassium: 3.5 mmol/L (ref 3.5–5.1)
Sodium: 136 mmol/L (ref 135–145)
Total Bilirubin: 0.6 mg/dL (ref 0.0–1.2)
Total Protein: 7.8 g/dL (ref 6.5–8.1)

## 2023-06-29 LAB — CBC WITH DIFFERENTIAL/PLATELET
Abs Immature Granulocytes: 0.02 10*3/uL (ref 0.00–0.07)
Basophils Absolute: 0 10*3/uL (ref 0.0–0.1)
Basophils Relative: 0 %
Eosinophils Absolute: 0.1 10*3/uL (ref 0.0–0.5)
Eosinophils Relative: 2 %
HCT: 40 % (ref 39.0–52.0)
Hemoglobin: 13.3 g/dL (ref 13.0–17.0)
Immature Granulocytes: 0 %
Lymphocytes Relative: 33 %
Lymphs Abs: 2 10*3/uL (ref 0.7–4.0)
MCH: 28.5 pg (ref 26.0–34.0)
MCHC: 33.3 g/dL (ref 30.0–36.0)
MCV: 85.7 fL (ref 80.0–100.0)
Monocytes Absolute: 0.6 10*3/uL (ref 0.1–1.0)
Monocytes Relative: 9 %
Neutro Abs: 3.4 10*3/uL (ref 1.7–7.7)
Neutrophils Relative %: 56 %
Platelets: 201 10*3/uL (ref 150–400)
RBC: 4.67 MIL/uL (ref 4.22–5.81)
RDW: 13.4 % (ref 11.5–15.5)
WBC: 6.1 10*3/uL (ref 4.0–10.5)
nRBC: 0 % (ref 0.0–0.2)

## 2023-06-29 LAB — TROPONIN I (HIGH SENSITIVITY): Troponin I (High Sensitivity): 3 ng/L (ref <18)

## 2023-06-29 MED ORDER — SODIUM CHLORIDE 0.9 % IV BOLUS
1000.0000 mL | Freq: Once | INTRAVENOUS | Status: AC
  Administered 2023-06-29: 1000 mL via INTRAVENOUS

## 2023-06-29 MED ORDER — METOCLOPRAMIDE HCL 5 MG/ML IJ SOLN
5.0000 mg | Freq: Once | INTRAMUSCULAR | Status: AC
  Administered 2023-06-29: 5 mg via INTRAVENOUS
  Filled 2023-06-29: qty 2

## 2023-06-29 MED ORDER — LORAZEPAM 2 MG/ML IJ SOLN
1.0000 mg | Freq: Once | INTRAMUSCULAR | Status: AC
  Administered 2023-06-29: 1 mg via INTRAVENOUS
  Filled 2023-06-29: qty 1

## 2023-06-29 MED ORDER — ONDANSETRON HCL 4 MG/2ML IJ SOLN
4.0000 mg | Freq: Once | INTRAMUSCULAR | Status: AC
  Administered 2023-06-29: 4 mg via INTRAVENOUS
  Filled 2023-06-29: qty 2

## 2023-06-29 NOTE — ED Triage Notes (Signed)
Pt presents to the ED via POV with complaints of dizziness that came on tonight while watch TV with associated CP and weakness. Pt states he has some intermittent nausea caused by the dizziness but has not vomited. Compliant with home meds for HTN. A&Ox4 at this time. Denies CP or SOB.

## 2023-06-29 NOTE — ED Provider Notes (Signed)
[place] EMERGENCY DEPARTMENT AT [facility] Provider Note   CSN: [id number] Arrival date & time: 06/29/23  2220     History  Chief Complaint  Patient presents with   Dizziness    Patrick Herrera is a 68 y.o. male.  Patient presents to the emergency department for evaluation of dizziness.  Patient reports that he has been experiencing positional dizziness for months.  Whenever he bends down and then stands up he gets briefly dizzy.  For the last 3 days symptoms have been worse.  Tonight while watching TV he became acutely very dizzy felt slightly confused.  He was able to drive himself to the ED.       Home Medications Prior to Admission medications   Medication Sig Start Date End Date Taking? Authorizing Provider  cephALEXin (KEFLEX) 500 MG capsule Take 1 capsule (500 mg total) by mouth 3 (three) times daily. 06/30/23  Yes Beatryce Colombo, Canary Brim, MD  meclizine (ANTIVERT) 25 MG tablet Take 1 tablet (25 mg total) by mouth 3 (three) times daily as needed for dizziness. 06/30/23  Yes Ebert Forrester, Canary Brim, MD  ALPRAZolam Prudy Feeler) 0.5 MG tablet Take 0.5 mg by mouth 2 (two) times daily. 02/28/23   [provider]  dapagliflozin propanediol (FARXIGA) 10 MG TABS tablet Take 10 mg by mouth daily.    [provider]  ELIQUIS 5 MG TABS tablet Take 1 tablet (5 mg total) by mouth 2 (two) times daily. 05/06/22   Strader, Lennart Pall, PA-C  esomeprazole (NEXIUM) 40 MG capsule Take 40 mg by mouth daily at 12 noon.    [provider]  hydrochlorothiazide (MICROZIDE) 12.5 MG capsule Take 1 capsule (12.5 mg total) by mouth every morning. 08/02/22   Pricilla Riffle, MD  lisinopril (ZESTRIL) 5 MG tablet Take 1 tablet (5 mg total) by mouth daily. 05/06/22   Strader, Lennart Pall, PA-C  oxyCODONE-acetaminophen (PERCOCET) 7.5-325 MG tablet Take 1 tablet by mouth every 8 (eight) hours. 03/02/23   [provider]  propranolol ER (INDERAL LA) 160 MG SR capsule Take 1  capsule (160 mg total) by mouth daily. 05/06/22   Strader, Lennart Pall, PA-C  rosuvastatin (CRESTOR) 10 MG tablet Take 1 tablet (10 mg total) by mouth daily. 10/27/21   Pricilla Riffle, MD  tamsulosin (FLOMAX) 0.4 MG CAPS capsule Take 0.4 mg by mouth daily. 03/19/23   [provider]      Allergies    Fentanyl, Oxycontin [oxycodone hcl], and Penicillins    Review of Systems   Review of Systems  Physical Exam Updated Vital Signs BP 92/69   Pulse 65   Temp 98.4 F (36.9 C) (Oral)   Resp 18   Ht 5\' 8"  (1.727 m)   Wt 82 kg   SpO2 94%   BMI 27.49 kg/m  Physical Exam Vitals and nursing note reviewed.  Constitutional:      General: He is not in acute distress.    Appearance: He is well-developed.  HENT:     Head: Normocephalic and atraumatic.     Mouth/Throat:     Mouth: Mucous membranes are moist.  Eyes:     General: Vision grossly intact. Gaze aligned appropriately.     Extraocular Movements: Extraocular movements intact.     Conjunctiva/sclera: Conjunctivae normal.  Cardiovascular:     Rate and Rhythm: Normal rate and regular rhythm.     Pulses: Normal pulses.     Heart sounds: Normal heart sounds, S1 normal and S2  normal. No murmur heard.    No friction rub. No gallop.  Pulmonary:     Effort: Pulmonary effort is normal. No respiratory distress.     Breath sounds: Normal breath sounds.  Abdominal:     Palpations: Abdomen is soft.     Tenderness: There is no abdominal tenderness. There is no guarding or rebound.     Hernia: No hernia is present.  Musculoskeletal:        General: No swelling.     Cervical back: Full passive range of motion without pain, normal range of motion and neck supple. No pain with movement, spinous process tenderness or muscular tenderness. Normal range of motion.     Right lower leg: No edema.     Left lower leg: No edema.  Skin:    General: Skin is warm and dry.     Capillary Refill: Capillary refill takes less than 2 seconds.      Findings: No ecchymosis, erythema, lesion or wound.  Neurological:     Mental Status: He is alert and oriented to person, place, and time.     GCS: GCS eye subscore is 4. GCS verbal subscore is 5. GCS motor subscore is 6.     Cranial Nerves: Cranial nerves 2-12 are intact.     Sensory: Sensation is intact.     Motor: Motor function is intact. No weakness or abnormal muscle tone.     Coordination: Coordination is intact.     Comments: Extraocular muscle movement: normal No visual field cut Pupils: equal and reactive both direct and consensual response is normal     Sensory function is intact to light touch, pinprick Proprioception intact  Grip strength 5/5 symmetric in upper extremities No pronator drift Normal finger to nose bilaterally  Lower extremity strength 5/5 against gravity Normal heel to shin bilaterally    Psychiatric:        Mood and Affect: Mood normal.        Speech: Speech normal.        Behavior: Behavior normal.     ED Results / Procedures / Treatments   Labs (all labs ordered are listed, but only abnormal results are displayed) Labs Reviewed  COMPREHENSIVE METABOLIC PANEL - Abnormal; Notable for the following components:      Result Value   Glucose, Bld 115 (*)    BUN 26 (*)    All other components within normal limits  URINALYSIS, ROUTINE W REFLEX MICROSCOPIC - Abnormal; Notable for the following components:   APPearance CLOUDY (*)    Hgb urine dipstick LARGE (*)    Protein, ur 100 (*)    Bacteria, UA RARE (*)    All other components within normal limits  URINE CULTURE  CBC WITH DIFFERENTIAL/PLATELET  TROPONIN I (HIGH SENSITIVITY)  TROPONIN I (HIGH SENSITIVITY)    EKG EKG Interpretation Date/Time:  Thursday June 29 2023 22:25:37 EST Ventricular Rate:  77 PR Interval:    QRS Duration:  70 QT Interval:  372 QTC Calculation: 420 R Axis:   78  Text Interpretation: Atrial fibrillation Low voltage QRS Abnormal ECG When compared with ECG of  11-Jun-2022 04:22, PREVIOUS ECG IS PRESENT Confirmed by Gilda Crease [phone]) on 06/29/2023 10:58:39 PM  Radiology DG Chest 2 View Result Date: 06/29/2023 CLINICAL DATA:  Dizziness, vomiting, confusion EXAM: CHEST - 2 VIEW COMPARISON:  06/11/2022 FINDINGS: Heart and mediastinal contours are within normal limits. No focal opacities or effusions. No acute bony abnormality. Prior right shoulder replacement. No  pneumothorax. IMPRESSION: No active cardiopulmonary disease. Electronically Signed   By: Charlett Nose M.D.   On: 06/29/2023 23:09   CT HEAD WO CONTRAST ( ) Result Date: 06/29/2023 CLINICAL DATA:  Headache, new onset (Age >= 51y) EXAM: CT HEAD WITHOUT CONTRAST TECHNIQUE: Contiguous axial images were obtained from the base of the skull through the vertex without intravenous contrast. RADIATION DOSE REDUCTION: This exam was performed according to the departmental dose-optimization program which includes automated exposure control, adjustment of the mA and/or kV according to patient size and/or use of iterative reconstruction technique. COMPARISON:  None Available. FINDINGS: Brain: Normal anatomic configuration. Mild parenchymal volume loss is commensurate with the patient's age. No abnormal intra or extra-axial mass lesion or fluid collection. No abnormal mass effect or midline shift. No evidence of acute intracranial hemorrhage or infarct. Ventricular size is normal. Cerebellum unremarkable. Vascular: Unremarkable Skull: Intact Sinuses/Orbits: Mucous retention cyst within the visualized left maxillary sinus. Remaining paranasal sinuses are clear. Orbits are unremarkable. Other: Mastoid air cells and middle ear cavities are clear. IMPRESSION: 1. No acute intracranial abnormality. No calvarial fracture. 2. Mild senescent change. Electronically Signed   By: Helyn Numbers M.D.   On: 06/29/2023 23:02    Procedures Procedures    Medications Ordered in ED Medications  cefTRIAXone (ROCEPHIN) 1 g  in sodium chloride 0.9 % 100 mL IVPB (1 g Intravenous New Bag/Given 06/30/23 0140)  sodium chloride 0.9 % bolus 1,000 mL (1,000 mLs Intravenous Bolus 06/29/23 2329)  ondansetron (ZOFRAN) injection 4 mg (4 mg Intravenous Given 06/29/23 2327)  LORazepam (ATIVAN) injection 1 mg (1 mg Intravenous Given 06/29/23 2327)  metoCLOPramide (REGLAN) injection 5 mg (5 mg Intravenous Given 06/29/23 2328)    ED Course/ Medical Decision Making/ A&P                                 Medical Decision Making Amount and/or Complexity of Data Reviewed Labs: ordered. Radiology: ordered.  Risk Prescription drug management.   Differential Diagnosis considered includes, but not limited to: Stroke; TIA; vertebrobasilar insufficiency; peripheral vertigo; orthostatic hypotension  Presents to the emergency department for evaluation of positional dizziness and mild vertigo.  Symptoms ongoing for about a week, worsened tonight.  Patient has no focal neurologic deficits on exam.  CT head unremarkable.  Patient administered IV fluids, antiemetics and Ativan, has had resolution of his symptoms.  Doubt stroke.  Lab work unremarkable other than evidence of urinary tract infection.  Will send culture.  Given Rocephin and will discharge with antibiotics.  Follow-up with PCP.        Final Clinical Impression(s) / ED Diagnoses Final diagnoses:  Vertigo  Urinary tract infection with hematuria, site unspecified    Rx / DC Orders ED Discharge Orders          Ordered    cephALEXin (KEFLEX) 500 MG capsule  3 times daily        06/30/23 0156    meclizine (ANTIVERT) 25 MG tablet  3 times daily PRN        06/30/23 0156              Gilda Crease, MD 06/30/23 [phone]

## 2023-06-30 LAB — URINALYSIS, ROUTINE W REFLEX MICROSCOPIC
Bilirubin Urine: NEGATIVE
Glucose, UA: NEGATIVE mg/dL
Ketones, ur: NEGATIVE mg/dL
Leukocytes,Ua: NEGATIVE
Nitrite: NEGATIVE
Protein, ur: 100 mg/dL — AB
RBC / HPF: >50 RBC/hpf (ref 0–5)
Specific Gravity, Urine: 1.019 (ref 1.005–1.030)
pH: 6 (ref 5.0–8.0)

## 2023-06-30 LAB — TROPONIN I (HIGH SENSITIVITY): Troponin I (High Sensitivity): 3 ng/L (ref <18)

## 2023-06-30 MED ORDER — SODIUM CHLORIDE 0.9 % IV SOLN
1.0000 g | Freq: Once | INTRAVENOUS | Status: AC
  Administered 2023-06-30: 1 g via INTRAVENOUS
  Filled 2023-06-30: qty 10

## 2023-06-30 MED ORDER — CEPHALEXIN 500 MG PO CAPS: 500.0000 mg | ORAL_CAPSULE | Freq: Three times a day (TID) | ORAL | 0 refills | Status: DC

## 2023-06-30 MED ORDER — MECLIZINE HCL 25 MG PO TABS: 25.0000 mg | ORAL_TABLET | Freq: Three times a day (TID) | ORAL | 0 refills | Status: AC | PRN

## 2023-07-01 LAB — URINE CULTURE: Culture: NO GROWTH

## 2023-07-11 ENCOUNTER — Telehealth: Payer: Self-pay | Admitting: *Deleted

## 2023-07-11 ENCOUNTER — Encounter (HOSPITAL_COMMUNITY): Payer: Self-pay | Admitting: Internal Medicine

## 2023-07-11 ENCOUNTER — Ambulatory Visit (HOSPITAL_COMMUNITY): Payer: 59 | Admitting: Anesthesiology

## 2023-07-11 ENCOUNTER — Other Ambulatory Visit: Payer: Self-pay

## 2023-07-11 ENCOUNTER — Ambulatory Visit (HOSPITAL_COMMUNITY)
Admission: RE | Admit: 2023-07-11 | Discharge: 2023-07-11 | Disposition: A | Discharge status: Home / Self Care | Payer: 59 | Attending: Internal Medicine | Admitting: Internal Medicine

## 2023-07-11 ENCOUNTER — Encounter (HOSPITAL_COMMUNITY)
Admission: RE | Disposition: A | Discharge status: Home / Self Care | Payer: Self-pay | Source: Home / Self Care | Attending: Internal Medicine

## 2023-07-11 ENCOUNTER — Other Ambulatory Visit: Payer: Self-pay | Admitting: *Deleted

## 2023-07-11 DIAGNOSIS — Z7901 Long term (current) use of anticoagulants: Secondary | ICD-10-CM | POA: Diagnosis not present

## 2023-07-11 DIAGNOSIS — K219 Gastro-esophageal reflux disease without esophagitis: Secondary | ICD-10-CM | POA: Insufficient documentation

## 2023-07-11 DIAGNOSIS — I4821 Permanent atrial fibrillation: Secondary | ICD-10-CM | POA: Insufficient documentation

## 2023-07-11 DIAGNOSIS — K5730 Diverticulosis of large intestine without perforation or abscess without bleeding: Secondary | ICD-10-CM

## 2023-07-11 DIAGNOSIS — D122 Benign neoplasm of ascending colon: Secondary | ICD-10-CM

## 2023-07-11 DIAGNOSIS — R634 Abnormal weight loss: Secondary | ICD-10-CM | POA: Insufficient documentation

## 2023-07-11 DIAGNOSIS — D123 Benign neoplasm of transverse colon: Secondary | ICD-10-CM

## 2023-07-11 DIAGNOSIS — I10 Essential (primary) hypertension: Secondary | ICD-10-CM | POA: Insufficient documentation

## 2023-07-11 DIAGNOSIS — K6389 Other specified diseases of intestine: Secondary | ICD-10-CM

## 2023-07-11 DIAGNOSIS — Z6826 Body mass index (BMI) 26.0-26.9, adult: Secondary | ICD-10-CM | POA: Insufficient documentation

## 2023-07-11 DIAGNOSIS — R7303 Prediabetes: Secondary | ICD-10-CM

## 2023-07-11 DIAGNOSIS — K648 Other hemorrhoids: Secondary | ICD-10-CM | POA: Insufficient documentation

## 2023-07-11 DIAGNOSIS — Z1211 Encounter for screening for malignant neoplasm of colon: Secondary | ICD-10-CM | POA: Insufficient documentation

## 2023-07-11 HISTORY — PX: POLYPECTOMY: SHX5525

## 2023-07-11 HISTORY — PX: COLONOSCOPY WITH PROPOFOL: SHX5780

## 2023-07-11 LAB — GLUCOSE, CAPILLARY: Glucose-Capillary: 73 mg/dL (ref 70–99)

## 2023-07-11 SURGERY — COLONOSCOPY WITH PROPOFOL
Anesthesia: General

## 2023-07-11 MED ORDER — LIDOCAINE HCL (CARDIAC) PF 100 MG/5ML IV SOSY
PREFILLED_SYRINGE | INTRAVENOUS | Status: DC | PRN
  Administered 2023-07-11: 50 mg via INTRATRACHEAL

## 2023-07-11 MED ORDER — PROPOFOL 500 MG/50ML IV EMUL
INTRAVENOUS | Status: DC | PRN
  Administered 2023-07-11: 150 ug/kg/min via INTRAVENOUS

## 2023-07-11 MED ORDER — SODIUM CHLORIDE 0.9% FLUSH: 3.0000 mL | INTRAVENOUS | Status: DC | PRN

## 2023-07-11 MED ORDER — LIDOCAINE HCL (PF) 2 % IJ SOLN
INTRAMUSCULAR | Status: AC
  Filled 2023-07-11: qty 5

## 2023-07-11 MED ORDER — SODIUM CHLORIDE 0.9% FLUSH: 3.0000 mL | Freq: Two times a day (BID) | INTRAVENOUS | Status: DC

## 2023-07-11 MED ORDER — PROPOFOL 10 MG/ML IV BOLUS
INTRAVENOUS | Status: DC | PRN
  Administered 2023-07-11: 100 mg via INTRAVENOUS

## 2023-07-11 NOTE — Transfer of Care (Signed)
 Immediate Anesthesia Transfer of Care Note  Patient: Patrick Herrera  Procedure(s) Performed: COLONOSCOPY WITH PROPOFOL  POLYPECTOMY  Patient Location: Endoscopy Unit  Anesthesia Type:General  Level of Consciousness: awake  Airway & Oxygen Therapy: Patient Spontanous Breathing  Post-op Assessment: Report given to RN  Post vital signs: Reviewed and stable  Last Vitals:  Vitals Value Taken Time  BP    Temp 36.4 C 07/11/23 1241  Pulse 91 07/11/23 1241  Resp    SpO2      Last Pain:  Vitals:   07/11/23 1241  TempSrc: Oral  PainSc:       Patients Stated Pain Goal: 8 (07/11/23 1106)  Complications: No notable events documented.

## 2023-07-11 NOTE — Op Note (Signed)
 [facility] Patient Name: Patrick Herrera Procedure Date: 07/11/2023 11:55 AM MRN: [id number] Date of Birth: 1955/12/22 Attending MD: Carlin POUR. Cindie , [place], [id number] CSN: [id number] Age: 68 Admit Type: Outpatient Procedure:                Colonoscopy Indications:              Screening for colorectal malignant neoplasm Providers:                Carlin POUR. Cindie, DO, Harlene Lips, Bascom Blush Referring MD:              Medicines:                See the Anesthesia note for documentation of the                            administered medications Complications:            No immediate complications. Estimated Blood Loss:     Estimated blood loss was minimal. Procedure:                Pre-Anesthesia Assessment:                           - The anesthesia plan was to use monitored                            anesthesia care (MAC).                           After obtaining informed consent, the colonoscope                            was passed under direct vision. Throughout the                            procedure, the patient's blood pressure, pulse, and                            oxygen saturations were monitored continuously. The                            [id number]-HQ190L ([id number]) scope was introduced through                            the anus and advanced to the the cecum, identified                            by appendiceal orifice and ileocecal valve. The                            colonoscopy was performed without difficulty. The                            patient tolerated the procedure well. The quality  of the bowel preparation was evaluated using the                            BBPS [facility] Bowel Preparation Scale) with scores                            of: Right Colon = 2 (minor amount of residual                            staining, small fragments of stool and/or opaque                            liquid, but mucosa  seen well), Transverse Colon = 2                            (minor amount of residual staining, small fragments                            of stool and/or opaque liquid, but mucosa seen                            well) and Left Colon = 2 (minor amount of residual                            staining, small fragments of stool and/or opaque                            liquid, but mucosa seen well). The total BBPS score                            equals 6. The quality of the bowel preparation was                            good. Scope In: 12:13:23 PM Scope Out: 12:33:25 PM Scope Withdrawal Time: 0 hours 13 minutes 1 second  Total Procedure Duration: 0 hours 20 minutes 2 seconds  Findings:      Non-bleeding internal hemorrhoids were found during endoscopy.      Multiple medium-mouthed and small-mouthed diverticula were found in the       entire colon.      A 14 mm polyp was found in the ascending colon. The polyp was sessile.       The polyp was removed with a cold snare. Resection and retrieval were       complete.      A 5 mm polyp was found in the transverse colon. The polyp was sessile.       The polyp was removed with a cold snare. Resection and retrieval were       complete.      A submucosal non-obstructing medium-sized mass was found in the       ascending colon. The mass was non-circumferential. No bleeding was       present. NBMI without adenomatous features, ?lipoma vs cystic lesion.       See pictures. Impression:               -  Non-bleeding internal hemorrhoids.                           - Diverticulosis in the entire examined colon.                           - One 14 mm polyp in the ascending colon, removed                            with a cold snare. Resected and retrieved.                           - One 5 mm polyp in the transverse colon, removed                            with a cold snare. Resected and retrieved.                           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 Moderate Sedation:      Per Anesthesia Care Recommendation:           - Patient has a contact number available for                            emergencies. The signs and symptoms of potential                            delayed complications were discussed with the                            patient. Return to normal activities tomorrow.                            Written discharge instructions were provided to the                            patient.                           - Resume previous diet.                           - Continue present medications.                           - Await pathology results.                           - Repeat colonoscopy in 3 years for surveillance.                           - Will order CT abd/pelvis with oral and IV                            contrast to further evaluate lesion in ascending  colon.                           - Return to GI clinic in 3 months. Procedure Code(s):        --- Professional ---                           [phone], Colonoscopy, flexible; with removal of                            tumor(s), polyp(s), or other lesion(s) by snare                            technique Diagnosis Code(s):        --- Professional ---                           Z12.11, Encounter for screening for malignant                            neoplasm of colon                           K64.8, Other hemorrhoids                           D12.2, Benign neoplasm of ascending colon                           D12.3, Benign neoplasm of transverse colon (hepatic                            flexure or splenic flexure)                           K57.30, Diverticulosis of large intestine without                            perforation or abscess without bleeding CPT copyright 2022 American Medical Association. All rights reserved. The codes documented in this report are preliminary and upon coder review may  be revised to meet current compliance  requirements. Carlin POUR. Cindie, DO Carlin POUR. Cindie, DO 07/11/2023 12:45:16 PM This report has been signed electronically. Number of Addenda: 0

## 2023-07-11 NOTE — Anesthesia Preprocedure Evaluation (Addendum)
Anesthesia Evaluation  Patient identified by MRN, date of birth, ID band Patient awake    Reviewed: Allergy & Precautions, H&P , NPO status , Patient's Chart, lab work & pertinent test results, reviewed documented beta blocker date and time   Airway Mallampati: II  TM Distance: >3 FB Neck ROM: full    Dental  (+) Dental Advisory Given, Edentulous Upper, Missing   Pulmonary neg pulmonary ROS   Pulmonary exam normal breath sounds clear to auscultation       Cardiovascular Exercise Tolerance: Good hypertension, Normal cardiovascular exam+ dysrhythmias Atrial Fibrillation  Rhythm:regular Rate:Normal     Neuro/Psych negative neurological ROS  negative psych ROS   GI/Hepatic negative GI ROS, Neg liver ROS,GERD  ,,  Endo/Other  Pre diabetes  Renal/GU negative Renal ROS  negative genitourinary   Musculoskeletal   Abdominal   Peds  Hematology negative hematology ROS (+)   Anesthesia Other Findings   Reproductive/Obstetrics negative OB ROS                             Anesthesia Physical Anesthesia Plan  ASA: 3  Anesthesia Plan: General   Post-op Pain Management: Minimal or no pain anticipated   Induction: Intravenous  PONV Risk Score and Plan: Propofol infusion  Airway Management Planned: Natural Airway and Nasal Cannula  Additional Equipment: None  Intra-op Plan:   Post-operative Plan:   Informed Consent: I have reviewed the patients History and Physical, chart, labs and discussed the procedure including the risks, benefits and alternatives for the proposed anesthesia with the patient or authorized representative who has indicated his/her understanding and acceptance.     Dental Advisory Given  Plan Discussed with: CRNA  Anesthesia Plan Comments:        Anesthesia Quick Evaluation

## 2023-07-11 NOTE — Interval H&P Note (Signed)
 History and Physical Interval Note:  07/11/2023 11:28 AM  Patrick Herrera  has presented today for surgery, with the diagnosis of screening.  The various methods of treatment have been discussed with the patient and family. After consideration of risks, benefits and other options for treatment, the patient has consented to  Procedure(s) with comments: COLONOSCOPY WITH PROPOFOL  (N/A) - 1230pm, asa 2 as a surgical intervention.  The patient's history has been reviewed, patient examined, no change in status, stable for surgery.  I have reviewed the patient's chart and labs.  Questions were answered to the patient's satisfaction.     Carlin MARLA Hasty

## 2023-07-11 NOTE — Telephone Encounter (Signed)
Called pt and he is aware of his CT A/P appt 2/11, arrival 3:00pm.

## 2023-07-11 NOTE — Anesthesia Postprocedure Evaluation (Signed)
 Anesthesia Post Note  Patient: Patrick Herrera  Procedure(s) Performed: COLONOSCOPY WITH PROPOFOL  POLYPECTOMY  Patient location during evaluation: Endoscopy Anesthesia Type: General Level of consciousness: awake and alert Pain management: pain level controlled Vital Signs Assessment: post-procedure vital signs reviewed and stable Respiratory status: spontaneous breathing Cardiovascular status: blood pressure returned to baseline and stable Postop Assessment: no apparent nausea or vomiting Anesthetic complications: no   No notable events documented.   Last Vitals:  Vitals:   07/11/23 1106 07/11/23 1241  BP: (!) 147/81 112/65  Pulse: 97 91  Resp:  (!) 22  Temp: 36.5 C 36.4 C  SpO2: 100% 97%    Last Pain:  Vitals:   07/11/23 1241  TempSrc: Oral  PainSc: 0-No pain                 Thaniel Coluccio

## 2023-07-11 NOTE — Discharge Instructions (Addendum)
  Colonoscopy Discharge Instructions  Read the instructions outlined below and refer to this sheet in the next few weeks. These discharge instructions provide you with general information on caring for yourself after you leave the hospital. Your doctor may also give you specific instructions. While your treatment has been planned according to the most current medical practices available, unavoidable complications occasionally occur.   ACTIVITY You may resume your regular activity, but move at a slower pace for the next 24 hours.  Take frequent rest periods for the next 24 hours.  Walking will help get rid of the air and reduce the bloated feeling in your belly (abdomen).  No driving for 24 hours (because of the medicine (anesthesia) used during the test).   Do not sign any important legal documents or operate any machinery for 24 hours (because of the anesthesia used during the test).  NUTRITION Drink plenty of fluids.  You may resume your normal diet as instructed by your doctor.  Begin with a light meal and progress to your normal diet. Heavy or fried foods are harder to digest and may make you feel sick to your stomach (nauseated).  Avoid alcoholic beverages for 24 hours or as instructed.  MEDICATIONS You may resume your normal medications unless your doctor tells you otherwise.  WHAT YOU CAN EXPECT TODAY Some feelings of bloating in the abdomen.  Passage of more gas than usual.  Spotting of blood in your stool or on the toilet paper.  IF YOU HAD POLYPS REMOVED DURING THE COLONOSCOPY: No aspirin products for 7 days or as instructed.  No alcohol for 7 days or as instructed.  Eat a soft diet for the next 24 hours.  FINDING OUT THE RESULTS OF YOUR TEST Not all test results are available during your visit. If your test results are not back during the visit, make an appointment with your caregiver to find out the results. Do not assume everything is normal if you have not heard from your  caregiver or the medical facility. It is important for you to follow up on all of your test results.  SEEK IMMEDIATE MEDICAL ATTENTION IF: You have more than a spotting of blood in your stool.  Your belly is swollen (abdominal distention).  You are nauseated or vomiting.  You have a temperature over 101.  You have abdominal pain or discomfort that is severe or gets worse throughout the day.   Your colonoscopy revealed 2 polyp(s), one of which was medium sized, which I removed successfully. Await pathology results, my office will contact you. I recommend repeating colonoscopy in 3 years for surveillance purposes.   You also have diverticulosis and internal hemorrhoids. I would recommend increasing fiber in your diet or adding OTC Benefiber/Metamucil. Be sure to drink at least 4 to 6 glasses of water daily.   You also had a medium sized submucosal lesion in the right side of your colon.  This is likely benign and nothing to worry about.  I think to be safe, we should order a CT abdomen pelvis to further evaluate.  I will have my office work on this.  We will call with results.  I am not concerned about cancer in regards to this lesion.    I hope you have a great rest of your week!  Patrick Herrera. Marletta Lor, D.O. Gastroenterology and Hepatology [facility]

## 2023-07-12 ENCOUNTER — Encounter (HOSPITAL_COMMUNITY): Payer: Self-pay | Admitting: Internal Medicine

## 2023-07-12 LAB — SURGICAL PATHOLOGY

## 2023-07-18 ENCOUNTER — Ambulatory Visit (HOSPITAL_COMMUNITY)
Admission: RE | Admit: 2023-07-18 | Discharge: 2023-07-18 | Disposition: A | Discharge status: Home / Self Care | Payer: 59 | Source: Ambulatory Visit | Attending: Internal Medicine | Admitting: Internal Medicine

## 2023-07-18 DIAGNOSIS — K6389 Other specified diseases of intestine: Secondary | ICD-10-CM | POA: Diagnosis present

## 2023-07-18 MED ORDER — IOHEXOL 300 MG/ML  SOLN
100.0000 mL | Freq: Once | INTRAMUSCULAR | Status: AC | PRN
  Administered 2023-07-18: 100 mL via INTRAVENOUS

## 2023-07-25 ENCOUNTER — Other Ambulatory Visit: Payer: Self-pay | Admitting: Internal Medicine

## 2023-07-25 ENCOUNTER — Other Ambulatory Visit: Payer: Self-pay | Admitting: Student

## 2023-07-25 DIAGNOSIS — I4821 Permanent atrial fibrillation: Secondary | ICD-10-CM

## 2023-07-25 DIAGNOSIS — I10 Essential (primary) hypertension: Secondary | ICD-10-CM

## 2023-07-25 NOTE — Telephone Encounter (Signed)
 Eliquis 5mg  refill request received. Patient is 68 years old, weight-79.4kg, Crea-1.01 on 06/29/23, Diagnosis-Afib, and last seen by [name] on 03/28/23. Dose is appropriate based on dosing criteria. Will send in refill to requested pharmacy.

## 2023-08-03 ENCOUNTER — Other Ambulatory Visit: Payer: Self-pay | Admitting: *Deleted

## 2023-08-03 DIAGNOSIS — R9389 Abnormal findings on diagnostic imaging of other specified body structures: Secondary | ICD-10-CM

## 2023-08-23 ENCOUNTER — Ambulatory Visit (INDEPENDENT_AMBULATORY_CARE_PROVIDER_SITE_OTHER): Payer: 59 | Admitting: Urology

## 2023-08-23 VITALS — BP 132/63 | HR 86

## 2023-08-23 DIAGNOSIS — N3289 Other specified disorders of bladder: Secondary | ICD-10-CM | POA: Diagnosis not present

## 2023-08-23 LAB — URINALYSIS, ROUTINE W REFLEX MICROSCOPIC
Bilirubin, UA: NEGATIVE
Ketones, UA: NEGATIVE
Nitrite, UA: NEGATIVE
Specific Gravity, UA: 1.03 (ref 1.005–1.030)
Urobilinogen, Ur: 0.2 mg/dL (ref 0.2–1.0)
pH, UA: 6 (ref 5.0–7.5)

## 2023-08-23 LAB — MICROSCOPIC EXAMINATION
Bacteria, UA: NONE SEEN
RBC, Urine: >30 /HPF — AB (ref 0–2)

## 2023-08-23 MED ORDER — CIPROFLOXACIN HCL 500 MG PO TABS
500.0000 mg | ORAL_TABLET | Freq: Once | ORAL | Status: AC
  Administered 2023-08-23: 500 mg via ORAL

## 2023-08-23 NOTE — H&P (View-Only) (Signed)
 08/23/2023 9:20 AM   [name] [birth year] [id number]  Referring provider: Lanelle Bal, DO [address] [place],  [place] [address]  Bladder mas  HPI: Mr Pott is a [phone] here for evaluation of bladder tumors/bladder wall thickening. He underwent CT 07/18/23 which showed multiple bladder tumors and a liver mass. He has been having gross hematuria for the past 18 months. He has a hx of nephrolithiasis requiring ESWL and URS. No tobacco abuse hx.    PMH: Past Medical History:  Diagnosis Date   Atrial fibrillation (HCC)    Chronic pain    GERD (gastroesophageal reflux disease)    Hyperlipidemia    Hypertension    Kidney stones    Pre-diabetes     Surgical History: Past Surgical History:  Procedure Laterality Date   bil knee surgery     COLONOSCOPY WITH PROPOFOL N/A 07/11/2023   Procedure: COLONOSCOPY WITH PROPOFOL;  Surgeon: Lanelle Bal, DO;  Location: [facility];  Service: Endoscopy;  Laterality: N/A;  1230pm, asa 2   ELBOW SURGERY     NECK SURGERY     x4   POLYPECTOMY  07/11/2023   Procedure: POLYPECTOMY;  Surgeon: Lanelle Bal, DO;  Location: [facility];  Service: Endoscopy;;   REVERSE SHOULDER ARTHROPLASTY Right 09/30/2021   Procedure: REVERSE SHOULDER ARTHROPLASTY;  Surgeon: Bjorn Pippin, MD;  Location:  [facility];  Service: Orthopedics;  Laterality: Right;   SHOULDER SURGERY     x4    Home Medications:  Allergies as of 08/23/2023       Reactions   Fentanyl Nausea And Vomiting   Oxycontin [oxycodone Hcl] Nausea And Vomiting   Penicillins Nausea And Vomiting   Has patient had a PCN reaction causing immediate rash, facial/tongue/throat swelling, SOB or lightheadedness with hypotension: no Has patient had a PCN reaction causing severe rash involving mucus membranes or skin necrosis: no No Has patient had a PCN reaction that required hospitalization: No Has patient had a PCN reaction occurring within the last 10 years: No If all  of the above answers are "NO", then may proceed with Cephalosporin use.        Medication List        Accurate as of August 23, 2023  9:20 AM. If you have any questions, ask your nurse or doctor.          ALPRAZolam 0.5 MG tablet Commonly known as: XANAX Take 0.5 mg by mouth 2 (two) times daily.   cephALEXin 500 MG capsule Commonly known as: KEFLEX Take 1 capsule (500 mg total) by mouth 3 (three) times daily.   Eliquis 5 MG Tabs tablet Generic drug: apixaban Take 1 tablet by mouth twice daily   esomeprazole 40 MG capsule Commonly known as: NEXIUM Take 40 mg by mouth daily at 12 noon.   Farxiga 10 MG Tabs tablet Generic drug: dapagliflozin propanediol Take 10 mg by mouth daily.   hydrochlorothiazide 12.5 MG capsule Commonly known as: MICROZIDE Take 1 capsule by mouth once daily in the morning   lisinopril 5 MG tablet Commonly known as: ZESTRIL Take 1 tablet (5 mg total) by mouth daily.   meclizine 25 MG tablet Commonly known as: ANTIVERT Take 1 tablet (25 mg total) by mouth 3 (three) times daily as needed for dizziness.   oxyCODONE-acetaminophen 7.5-325 MG tablet Commonly known as: PERCOCET Take 1 tablet by mouth every 8 (eight) hours.   propranolol ER 160 MG SR capsule Commonly known as: INDERAL  LA Take 1 capsule (160 mg total) by mouth daily.   rosuvastatin 10 MG tablet Commonly known as: CRESTOR Take 1 tablet (10 mg total) by mouth daily.   tamsulosin 0.4 MG Caps capsule Commonly known as: FLOMAX Take 0.4 mg by mouth daily.        Allergies:  Allergies  Allergen Reactions   Fentanyl Nausea And Vomiting   Oxycontin [Oxycodone Hcl] Nausea And Vomiting   Penicillins Nausea And Vomiting    Has patient had a PCN reaction causing immediate rash, facial/tongue/throat swelling, SOB or lightheadedness with hypotension: no Has patient had a PCN reaction causing severe rash involving mucus membranes or skin necrosis: no No Has patient had a PCN  reaction that required hospitalization: No Has patient had a PCN reaction occurring within the last 10 years: No If all of the above answers are "NO", then may proceed with Cephalosporin use.     Family History: Family History  Problem Relation Age of Onset   Heart attack Mother     Social History:  reports that he has never smoked. He has never used smokeless tobacco. He reports current alcohol use. He reports current drug use. Drugs: Marijuana and Oxycodone.  ROS: All other review of systems were reviewed and are negative except what is noted above in HPI  Physical Exam: BP 132/63   Pulse 86   Constitutional:  Alert and oriented, No acute distress. HEENT: [place] AT, moist mucus membranes.  Trachea midline, no masses. Cardiovascular: No clubbing, cyanosis, or edema. Respiratory: Normal respiratory effort, no increased work of breathing. GI: Abdomen is soft, nontender, nondistended, no abdominal masses GU: No CVA tenderness.  Lymph: No cervical or inguinal lymphadenopathy. Skin: No rashes, bruises or suspicious lesions. Neurologic: Grossly intact, no focal deficits, moving all 4 extremities. Psychiatric: Normal mood and affect.  Laboratory Data: Lab Results  Component Value Date   WBC 6.1 06/29/2023   HGB 13.3 06/29/2023   HCT 40.0 06/29/2023   MCV 85.7 06/29/2023   PLT 201 06/29/2023    Lab Results  Component Value Date   CREATININE 1.01 06/29/2023    No results found for: "PSA"  No results found for: "TESTOSTERONE"  No results found for: "HGBA1C"  Urinalysis    Component Value Date/Time   COLORURINE YELLOW 06/29/2023 2356   APPEARANCEUR CLOUDY (A) 06/29/2023 2356   LABSPEC 1.019 06/29/2023 2356   PHURINE 6.0 06/29/2023 2356   GLUCOSEU NEGATIVE 06/29/2023 2356   HGBUR LARGE (A) 06/29/2023 2356   BILIRUBINUR NEGATIVE 06/29/2023 2356   KETONESUR NEGATIVE 06/29/2023 2356   PROTEINUR 100 (A) 06/29/2023 2356   UROBILINOGEN 0.2 05/24/2012 1732   NITRITE  NEGATIVE 06/29/2023 2356   LEUKOCYTESUR NEGATIVE 06/29/2023 2356    Lab Results  Component Value Date   BACTERIA RARE (A) 06/29/2023    Pertinent Imaging:  No results found for this or any previous visit.  No results found for this or any previous visit.  No results found for this or any previous visit.  No results found for this or any previous visit.  No results found for this or any previous visit.  No results found for this or any previous visit.  No results found for this or any previous visit.  Results for orders placed during the hospital encounter of 12/02/16  CT RENAL STONE STUDY  Narrative CLINICAL DATA:  Lower back pain last night, it now migrated to the suprapubic region.  EXAM: CT ABDOMEN AND PELVIS WITHOUT CONTRAST  TECHNIQUE: Multidetector CT imaging  of the abdomen and pelvis was performed following the standard protocol without IV contrast.  COMPARISON:  05/24/2012  FINDINGS: Lower chest: Moderate hiatal hernia.  Hepatobiliary: Low-attenuation lesion in the inferior right hepatic lobe corresponds to the benign lesions observed on 05/14/2005. 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 No suspicious liver lesions. Gallbladder and bile ducts are unremarkable.  Pancreas: Unremarkable. No pancreatic ductal dilatation or surrounding inflammatory changes.  Spleen: Normal in size without focal abnormality.  Adrenals/Urinary Tract: Adrenal glands are unremarkable. Kidneys are normal, without renal calculi, focal lesion, or hydronephrosis. Bladder is unremarkable.  Stomach/Bowel: Acute inflammatory changes surround a diverticulum of the distal descending colon with appearances typical of acute diverticulitis. No abscess.  Hiatal hernia.  Stomach and small bowel are otherwise normal.  Appendix is normal.  Extensive colonic diverticulosis.  Vascular/Lymphatic: The abdominal aorta is normal in caliber with moderate atherosclerotic calcification. No  adenopathy.  Reproductive: Unremarkable  Other: No ascites  Musculoskeletal: No significant skeletal lesion.  IMPRESSION: 1. Acute diverticulitis, distal descending colon. No abscess. Extensive diverticulosis. 2. Benign lesion of the right hepatic lobe inferiorly, unchanged from 2006. 3. Aortic atherosclerosis. 4. Hiatal hernia.   Electronically Signed By: Ellery Plunk M.D. On: 12/02/2016 03:41   Assessment & Plan:    1. Bladder wall thickening (Primary) Schedule for bladder tumor resection - Urinalysis, Routine w reflex microscopic   No follow-ups on file.  [name], MD  [facility] [place]

## 2023-08-23 NOTE — Progress Notes (Signed)
 08/23/2023 9:20 AM   [name] [birth year] [id number]  Referring provider: Lanelle Bal, DO [address] [place],  [place] [address]  Bladder mas  HPI: Mr Pott is a [phone] here for evaluation of bladder tumors/bladder wall thickening. He underwent CT 07/18/23 which showed multiple bladder tumors and a liver mass. He has been having gross hematuria for the past 18 months. He has a hx of nephrolithiasis requiring ESWL and URS. No tobacco abuse hx.    PMH: Past Medical History:  Diagnosis Date   Atrial fibrillation (HCC)    Chronic pain    GERD (gastroesophageal reflux disease)    Hyperlipidemia    Hypertension    Kidney stones    Pre-diabetes     Surgical History: Past Surgical History:  Procedure Laterality Date   bil knee surgery     COLONOSCOPY WITH PROPOFOL N/A 07/11/2023   Procedure: COLONOSCOPY WITH PROPOFOL;  Surgeon: Lanelle Bal, DO;  Location: [facility];  Service: Endoscopy;  Laterality: N/A;  1230pm, asa 2   ELBOW SURGERY     NECK SURGERY     x4   POLYPECTOMY  07/11/2023   Procedure: POLYPECTOMY;  Surgeon: Lanelle Bal, DO;  Location: [facility];  Service: Endoscopy;;   REVERSE SHOULDER ARTHROPLASTY Right 09/30/2021   Procedure: REVERSE SHOULDER ARTHROPLASTY;  Surgeon: Bjorn Pippin, MD;  Location:  [facility];  Service: Orthopedics;  Laterality: Right;   SHOULDER SURGERY     x4    Home Medications:  Allergies as of 08/23/2023       Reactions   Fentanyl Nausea And Vomiting   Oxycontin [oxycodone Hcl] Nausea And Vomiting   Penicillins Nausea And Vomiting   Has patient had a PCN reaction causing immediate rash, facial/tongue/throat swelling, SOB or lightheadedness with hypotension: no Has patient had a PCN reaction causing severe rash involving mucus membranes or skin necrosis: no No Has patient had a PCN reaction that required hospitalization: No Has patient had a PCN reaction occurring within the last 10 years: No If all  of the above answers are "NO", then may proceed with Cephalosporin use.        Medication List        Accurate as of August 23, 2023  9:20 AM. If you have any questions, ask your nurse or doctor.          ALPRAZolam 0.5 MG tablet Commonly known as: XANAX Take 0.5 mg by mouth 2 (two) times daily.   cephALEXin 500 MG capsule Commonly known as: KEFLEX Take 1 capsule (500 mg total) by mouth 3 (three) times daily.   Eliquis 5 MG Tabs tablet Generic drug: apixaban Take 1 tablet by mouth twice daily   esomeprazole 40 MG capsule Commonly known as: NEXIUM Take 40 mg by mouth daily at 12 noon.   Farxiga 10 MG Tabs tablet Generic drug: dapagliflozin propanediol Take 10 mg by mouth daily.   hydrochlorothiazide 12.5 MG capsule Commonly known as: MICROZIDE Take 1 capsule by mouth once daily in the morning   lisinopril 5 MG tablet Commonly known as: ZESTRIL Take 1 tablet (5 mg total) by mouth daily.   meclizine 25 MG tablet Commonly known as: ANTIVERT Take 1 tablet (25 mg total) by mouth 3 (three) times daily as needed for dizziness.   oxyCODONE-acetaminophen 7.5-325 MG tablet Commonly known as: PERCOCET Take 1 tablet by mouth every 8 (eight) hours.   propranolol ER 160 MG SR capsule Commonly known as: INDERAL  LA Take 1 capsule (160 mg total) by mouth daily.   rosuvastatin 10 MG tablet Commonly known as: CRESTOR Take 1 tablet (10 mg total) by mouth daily.   tamsulosin 0.4 MG Caps capsule Commonly known as: FLOMAX Take 0.4 mg by mouth daily.        Allergies:  Allergies  Allergen Reactions   Fentanyl Nausea And Vomiting   Oxycontin [Oxycodone Hcl] Nausea And Vomiting   Penicillins Nausea And Vomiting    Has patient had a PCN reaction causing immediate rash, facial/tongue/throat swelling, SOB or lightheadedness with hypotension: no Has patient had a PCN reaction causing severe rash involving mucus membranes or skin necrosis: no No Has patient had a PCN  reaction that required hospitalization: No Has patient had a PCN reaction occurring within the last 10 years: No If all of the above answers are "NO", then may proceed with Cephalosporin use.     Family History: Family History  Problem Relation Age of Onset   Heart attack Mother     Social History:  reports that he has never smoked. He has never used smokeless tobacco. He reports current alcohol use. He reports current drug use. Drugs: Marijuana and Oxycodone.  ROS: All other review of systems were reviewed and are negative except what is noted above in HPI  Physical Exam: BP 132/63   Pulse 86   Constitutional:  Alert and oriented, No acute distress. HEENT: [place] AT, moist mucus membranes.  Trachea midline, no masses. Cardiovascular: No clubbing, cyanosis, or edema. Respiratory: Normal respiratory effort, no increased work of breathing. GI: Abdomen is soft, nontender, nondistended, no abdominal masses GU: No CVA tenderness.  Lymph: No cervical or inguinal lymphadenopathy. Skin: No rashes, bruises or suspicious lesions. Neurologic: Grossly intact, no focal deficits, moving all 4 extremities. Psychiatric: Normal mood and affect.  Laboratory Data: Lab Results  Component Value Date   WBC 6.1 06/29/2023   HGB 13.3 06/29/2023   HCT 40.0 06/29/2023   MCV 85.7 06/29/2023   PLT 201 06/29/2023    Lab Results  Component Value Date   CREATININE 1.01 06/29/2023    No results found for: "PSA"  No results found for: "TESTOSTERONE"  No results found for: "HGBA1C"  Urinalysis    Component Value Date/Time   COLORURINE YELLOW 06/29/2023 2356   APPEARANCEUR CLOUDY (A) 06/29/2023 2356   LABSPEC 1.019 06/29/2023 2356   PHURINE 6.0 06/29/2023 2356   GLUCOSEU NEGATIVE 06/29/2023 2356   HGBUR LARGE (A) 06/29/2023 2356   BILIRUBINUR NEGATIVE 06/29/2023 2356   KETONESUR NEGATIVE 06/29/2023 2356   PROTEINUR 100 (A) 06/29/2023 2356   UROBILINOGEN 0.2 05/24/2012 1732   NITRITE  NEGATIVE 06/29/2023 2356   LEUKOCYTESUR NEGATIVE 06/29/2023 2356    Lab Results  Component Value Date   BACTERIA RARE (A) 06/29/2023    Pertinent Imaging:  No results found for this or any previous visit.  No results found for this or any previous visit.  No results found for this or any previous visit.  No results found for this or any previous visit.  No results found for this or any previous visit.  No results found for this or any previous visit.  No results found for this or any previous visit.  Results for orders placed during the hospital encounter of 12/02/16  CT RENAL STONE STUDY  Narrative CLINICAL DATA:  Lower back pain last night, it now migrated to the suprapubic region.  EXAM: CT ABDOMEN AND PELVIS WITHOUT CONTRAST  TECHNIQUE: Multidetector CT imaging  of the abdomen and pelvis was performed following the standard protocol without IV contrast.  COMPARISON:  05/24/2012  FINDINGS: Lower chest: Moderate hiatal hernia.  Hepatobiliary: Low-attenuation lesion in the inferior right hepatic lobe corresponds to the benign lesions observed on 05/14/2005. 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 No suspicious liver lesions. Gallbladder and bile ducts are unremarkable.  Pancreas: Unremarkable. No pancreatic ductal dilatation or surrounding inflammatory changes.  Spleen: Normal in size without focal abnormality.  Adrenals/Urinary Tract: Adrenal glands are unremarkable. Kidneys are normal, without renal calculi, focal lesion, or hydronephrosis. Bladder is unremarkable.  Stomach/Bowel: Acute inflammatory changes surround a diverticulum of the distal descending colon with appearances typical of acute diverticulitis. No abscess.  Hiatal hernia.  Stomach and small bowel are otherwise normal.  Appendix is normal.  Extensive colonic diverticulosis.  Vascular/Lymphatic: The abdominal aorta is normal in caliber with moderate atherosclerotic calcification. No  adenopathy.  Reproductive: Unremarkable  Other: No ascites  Musculoskeletal: No significant skeletal lesion.  IMPRESSION: 1. Acute diverticulitis, distal descending colon. No abscess. Extensive diverticulosis. 2. Benign lesion of the right hepatic lobe inferiorly, unchanged from 2006. 3. Aortic atherosclerosis. 4. Hiatal hernia.   Electronically Signed By: Ellery Plunk M.D. On: 12/02/2016 03:41   Assessment & Plan:    1. Bladder wall thickening (Primary) Schedule for bladder tumor resection - Urinalysis, Routine w reflex microscopic   No follow-ups on file.  [name], MD  [facility] [place]

## 2023-08-29 ENCOUNTER — Encounter: Payer: Self-pay | Admitting: Urology

## 2023-08-29 NOTE — Patient Instructions (Signed)
 Transurethral Resection of Bladder Tumor  Transurethral resection of a bladder tumor is the removal (resection) of cancerous tissue (tumor) from the inside wall of the bladder. The bladder is the organ that holds urine. The tumor is removed through the tube that carries urine out of the body (urethra). In a transurethral resection, a thin telescope with a light, a tiny camera, and an electric cutting edge (resectoscope) is passed through the urethra. In men, the opening of the urethra is at the end of the penis. In women, it is just above the opening of the vagina. Tell a health care provider about: Any allergies you have. All medicines you are taking, including vitamins, herbs, eye drops, creams, and over-the-counter medicines. Any problems you or family members have had with anesthetic medicines. Any bleeding problems you have. Any surgeries you have had. Any medical conditions you have, including recent urinary tract infections. Whether you are pregnant or may be pregnant. What are the risks? Generally, this is a safe procedure. However, problems may occur, including: Infection. Bleeding. Allergic reactions to medicines. Damage to nearby structures or organs. Difficulty urinating from blockage of the urethra or not being able to urinate (urinary retention). Deep vein thrombosis. This is a blood clot that can develop in your leg. Recurring cancer. What happens before the procedure? When to stop eating and drinking Follow instructions from your health care provider about what you may eat and drink before your procedure. These may include: 8 hours before your procedure Stop eating most foods. Do not eat meat, fried foods, or fatty foods. Eat only light foods, such as toast or crackers. All liquids are okay except energy drinks and alcohol. 6 hours before your procedure Stop eating. Drink only clear liquids, such as water, clear fruit juice, black coffee, plain tea, and sports  drinks. Do not drink energy drinks or alcohol. 2 hours before your procedure Stop drinking all liquids. You may be allowed to take medicines with small sips of water. Medicines Ask your health care provider about: Changing or stopping your regular medicines. This is especially important if you are taking diabetes medicines or blood thinners. Taking medicines such as aspirin and ibuprofen. These medicines can thin your blood. Do not take these medicines unless your health care provider tells you to take them. Taking over-the-counter medicines, vitamins, herbs, and supplements. General instructions If you will be going home right after the procedure, plan to have a responsible adult: Take you home from the hospital or clinic. You will not be allowed to drive. Care for you for the time you are told. Ask your health care provider what steps will be taken to help prevent infection. These steps may include: Washing skin with a germ-killing soap. Taking antibiotic medicine. Do not use any products that contain nicotine or tobacco for at least 4 weeks before the procedure. These products include cigarettes, chewing tobacco, and vaping devices, such as e-cigarettes. If you need help quitting, ask your health care provider. What happens during the procedure? An IV will be inserted into one of your veins. You will be given one or more of the following: A medicine to help you relax (sedative). A medicine that is injected into your spine to numb the area below and slightly above the injection site (spinal anesthetic). A medicine that is injected into an area of your body to numb everything below the injection site (regional anesthetic). A medicine to make you fall asleep (general anesthetic). Your legs will be placed in foot rests (  stirrups) to open your legs and bend your knees. The resectoscope will be passed through your urethra and into your bladder. The part of your bladder with the tumor will be  resected by the cutting edge of the resectoscope. Fluid will be passed to rinse out the cut tissues (irrigation). The resectoscope will then be taken out. A small, thin tube (catheter) will be passed through your urethra and into your bladder. The catheter will drain urine into a bag outside of your body. The procedure may vary among health care providers and hospitals. What happens after the procedure? Your blood pressure, heart rate, breathing rate, and blood oxygen level will be monitored until you leave the hospital or clinic. You may continue to receive fluids and medicines through an IV. You will be given pain medicine to relieve pain. You will have a catheter to drain your urine. The amount of urine will be measured. If you have blood in your urine, your bladder may be rinsed out by passing fluid through your catheter. You will be encouraged to walk as soon as you can. You may have to wear compression stockings. These stockings help to prevent blood clots and reduce swelling in your legs. If you were given a sedative during the procedure, it can affect you for several hours. Do not drive or operate machinery until your health care provider says that it is safe. Summary Transurethral resection of a bladder tumor is the removal (resection) of a cancerous growth (tumor) on the inside wall of the bladder. To do this procedure, your health care provider uses a thin telescope with a light, a tiny camera, and an electric cutting edge (resectoscope) that is guided to your bladder through your urethra. The part of your bladder that is affected by the tumor will be resected by the cutting edge of the resectoscope. A catheter will be passed through your urethra and into your bladder. The catheter will drain urine into a bag outside of your body. If you will be going home right after the procedure, plan to have a responsible adult take you home from the hospital or clinic. You will not be allowed to  drive. This information is not intended to replace advice given to you by your health care provider. Make sure you discuss any questions you have with your health care provider. Document Revised: 05/28/2021 Document Reviewed: 05/28/2021 Elsevier Patient Education  2024 [employer].

## 2023-09-02 ENCOUNTER — Other Ambulatory Visit: Payer: Self-pay

## 2023-09-02 ENCOUNTER — Emergency Department (HOSPITAL_COMMUNITY)
Admission: EM | Admit: 2023-09-02 | Discharge: 2023-09-02 | Disposition: A | Discharge status: Home / Self Care | Attending: Emergency Medicine | Admitting: Emergency Medicine

## 2023-09-02 ENCOUNTER — Encounter (HOSPITAL_COMMUNITY): Payer: Self-pay | Admitting: Emergency Medicine

## 2023-09-02 DIAGNOSIS — M79604 Pain in right leg: Secondary | ICD-10-CM

## 2023-09-02 DIAGNOSIS — Z7901 Long term (current) use of anticoagulants: Secondary | ICD-10-CM | POA: Diagnosis not present

## 2023-09-02 DIAGNOSIS — Z760 Encounter for issue of repeat prescription: Secondary | ICD-10-CM | POA: Diagnosis not present

## 2023-09-02 DIAGNOSIS — R252 Cramp and spasm: Secondary | ICD-10-CM | POA: Insufficient documentation

## 2023-09-02 DIAGNOSIS — Z79899 Other long term (current) drug therapy: Secondary | ICD-10-CM

## 2023-09-02 DIAGNOSIS — Z5329 Procedure and treatment not carried out because of patient's decision for other reasons: Secondary | ICD-10-CM | POA: Diagnosis not present

## 2023-09-02 LAB — CBC WITH DIFFERENTIAL/PLATELET
Abs Immature Granulocytes: 0.02 10*3/uL (ref 0.00–0.07)
Basophils Absolute: 0 10*3/uL (ref 0.0–0.1)
Basophils Relative: 0 %
Eosinophils Absolute: 0 10*3/uL (ref 0.0–0.5)
Eosinophils Relative: 0 %
HCT: 44.1 % (ref 39.0–52.0)
Hemoglobin: 14.5 g/dL (ref 13.0–17.0)
Immature Granulocytes: 0 %
Lymphocytes Relative: 15 %
Lymphs Abs: 1.1 10*3/uL (ref 0.7–4.0)
MCH: 27.5 pg (ref 26.0–34.0)
MCHC: 32.9 g/dL (ref 30.0–36.0)
MCV: 83.7 fL (ref 80.0–100.0)
Monocytes Absolute: 0.5 10*3/uL (ref 0.1–1.0)
Monocytes Relative: 8 %
Neutro Abs: 5.3 10*3/uL (ref 1.7–7.7)
Neutrophils Relative %: 77 %
Platelets: 217 10*3/uL (ref 150–400)
RBC: 5.27 MIL/uL (ref 4.22–5.81)
RDW: 14.2 % (ref 11.5–15.5)
WBC: 7 10*3/uL (ref 4.0–10.5)
nRBC: 0 % (ref 0.0–0.2)

## 2023-09-02 LAB — BASIC METABOLIC PANEL WITH GFR
Anion gap: 12 (ref 5–15)
BUN: 33 mg/dL — ABNORMAL HIGH (ref 8–23)
CO2: 24 mmol/L (ref 22–32)
Calcium: 9.7 mg/dL (ref 8.9–10.3)
Chloride: 96 mmol/L — ABNORMAL LOW (ref 98–111)
Creatinine, Ser: 1.31 mg/dL — ABNORMAL HIGH (ref 0.61–1.24)
GFR, Estimated: >60 mL/min (ref >60)
Glucose, Bld: 93 mg/dL (ref 70–99)
Potassium: 3.9 mmol/L (ref 3.5–5.1)
Sodium: 132 mmol/L — ABNORMAL LOW (ref 135–145)

## 2023-09-02 LAB — MAGNESIUM: Magnesium: 2.2 mg/dL (ref 1.7–2.4)

## 2023-09-02 MED ORDER — HYDROCODONE-ACETAMINOPHEN 5-325 MG PO TABS
1.0000 | ORAL_TABLET | Freq: Once | ORAL | Status: AC
  Administered 2023-09-02: 1 via ORAL
  Filled 2023-09-02: qty 1

## 2023-09-02 NOTE — ED Provider Notes (Signed)
 [place] EMERGENCY DEPARTMENT AT [facility] Provider Note   CSN: [id number] Arrival date & time: 09/02/23  1922     History  Chief Complaint  Patient presents with   Medication Refill    Patrick Herrera is a 68 y.o. male.  He comes into the ER today complaining of bilateral leg cramps ongoing for "a while".  States that they feel like "charley horses".  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  He has history of A-fib and is on Eliquis.  Patient had told RN that triage that he was out of his chronic pain medicine and she reported that he said he went to the pain clinic today to be seen as a new patient and his medicine was was to be sent to [facility] but he was told that the prescription was incomplete and not able to be filled.  Patient tells me that he has been out of his oxycodone and Xanax for several months and they told him they were not going to prescribe him any further narcotics at the pain management clinic, cannot tell me further details about the visit.  No chest pain, shortness of breath, no fever or chills, has chronic shoulder pain from prior surgery.  His only acute pain today is the leg cramps that he is going on.   Medication Refill      Home Medications Prior to Admission medications   Medication Sig Start Date End Date Taking? Authorizing Provider  ALPRAZolam Prudy Feeler) 0.5 MG tablet Take 0.5 mg by mouth 2 (two) times daily. 02/28/23   [provider]  cephALEXin (KEFLEX) 500 MG capsule Take 1 capsule (500 mg total) by mouth 3 (three) times daily. 06/30/23   Gilda Crease, MD  dapagliflozin propanediol (FARXIGA) 10 MG TABS tablet Take 10 mg by mouth daily.    [provider]  ELIQUIS 5 MG TABS tablet Take 1 tablet by mouth twice daily 07/25/23   Iran Ouch, [place] M, PA-C  esomeprazole (NEXIUM) 40 MG capsule Take 40 mg by mouth daily at 12 noon.    [provider]   hydrochlorothiazide (MICROZIDE) 12.5 MG capsule Take 1 capsule by mouth once daily in the morning 07/25/23   Pricilla Riffle, MD  lisinopril (ZESTRIL) 5 MG tablet Take 1 tablet (5 mg total) by mouth daily. 05/06/22   Strader, Lennart Pall, PA-C  meclizine (ANTIVERT) 25 MG tablet Take 1 tablet (25 mg total) by mouth 3 (three) times daily as needed for dizziness. 06/30/23   Gilda Crease, MD  oxyCODONE-acetaminophen (PERCOCET) 7.5-325 MG tablet Take 1 tablet by mouth every 8 (eight) hours. 03/02/23   [provider]  propranolol ER (INDERAL LA) 160 MG SR capsule Take 1 capsule (160 mg total) by mouth daily. 05/06/22   Strader, Lennart Pall, PA-C  rosuvastatin (CRESTOR) 10 MG tablet Take 1 tablet (10 mg total) by mouth daily. 10/27/21   Pricilla Riffle, MD  tamsulosin (FLOMAX) 0.4 MG CAPS capsule Take 0.4 mg by mouth daily. 03/19/23   [provider]      Allergies    Fentanyl, Oxycontin [oxycodone hcl], and Penicillins    Review of Systems   Review of Systems  Physical Exam Updated Vital Signs BP 111/71 (BP Location: Right Arm)   Pulse 87   Temp 98 F (36.7 C) (Oral)   Resp 18   Ht 5\' 8"  (1.727 m)   Wt 79.4 kg  SpO2 99%   BMI 26.62 kg/m  Physical Exam Vitals and nursing note reviewed.  Constitutional:      General: He is not in acute distress.    Appearance: He is well-developed.  HENT:     Head: Normocephalic and atraumatic.  Eyes:     Extraocular Movements: Extraocular movements intact.     Conjunctiva/sclera: Conjunctivae normal.     Pupils: Pupils are equal, round, and reactive to light.  Cardiovascular:     Rate and Rhythm: Normal rate and regular rhythm.     Heart sounds: No murmur heard. Pulmonary:     Effort: Pulmonary effort is normal. No respiratory distress.     Breath sounds: Normal breath sounds.  Abdominal:     Palpations: Abdomen is soft.     Tenderness: There is no abdominal tenderness.  Musculoskeletal:        General: No swelling.      Cervical back: Neck supple.  Skin:    General: Skin is warm and dry.     Capillary Refill: Capillary refill takes less than 2 seconds.  Neurological:     General: No focal deficit present.     Mental Status: He is alert and oriented to person, place, and time.  Psychiatric:        Mood and Affect: Mood normal.     ED Results / Procedures / Treatments   Labs (all labs ordered are listed, but only abnormal results are displayed) Labs Reviewed - No data to display  EKG None  Radiology No results found.  Procedures Procedures    Medications Ordered in ED Medications - No data to display  ED Course/ Medical Decision Making/ A&P                                 Medical Decision Making Diagnosis includes but not limited to electrolyte derangement, restless leg syndrome, DVT, dehydration, chronic pain, other  ED course: Patient was complaining of being out of his chronic pain medicine and his Xanax.  I reviewed his PDMP and he has not had these filled since November 2024.  He reportedly went to pain management today and was told they were not going to fill his chronic pain medicines.  He was reporting to me that he was having cramps and spasms in his legs, offered 1 dose of hydrocodone here while we awaited labs.  Prior to labs being resulted patient had told nursing staff he is no longer going to stay and walked out of the ED.  I was not able to talk to him further prior to him leaving.  Amount and/or Complexity of Data Reviewed Labs: ordered.  Risk Prescription drug management.           Final Clinical Impression(s) / ED Diagnoses Final diagnoses:  None    Rx / DC Orders ED Discharge Orders     None         Josem Kaufmann 09/02/23 2237    Bethann Berkshire, MD 09/03/23 1135

## 2023-09-02 NOTE — ED Triage Notes (Signed)
 Pt arrived to ED via POV c/o not being able to get his pain medication and xanax filled by [facility].

## 2023-09-07 ENCOUNTER — Telehealth: Payer: Self-pay | Admitting: *Deleted

## 2023-09-07 NOTE — Telephone Encounter (Signed)
   Name: Patrick Herrera  DOB: 05/27/1956  MRN: [id number]  Primary Cardiologist: Dietrich Pates, MD   Preoperative team, please contact this patient and set up a phone call appointment for further preoperative risk assessment. Please obtain consent and complete medication review. Thank you for your help.  I confirm that guidance regarding antiplatelet and oral anticoagulation therapy has been completed and, if necessary, noted below.  Per Pharm D, patient may hold Eliquis for 3 days prior to procedure.    I also confirmed the patient resides in the state of [place]. As per [facility] Medical Board telemedicine laws, the patient must reside in the state in which the provider is licensed.   Carlos Levering, NP 09/07/2023, 3:31 PM [facility]

## 2023-09-07 NOTE — Telephone Encounter (Signed)
   Pre-operative Risk Assessment    Patient Name: Patrick Herrera  DOB: 1956-01-08 MRN: [id number]   Date of last office visit: 03/28/23 [name], PAC Date of next office visit: NONE   Request for Surgical Clearance    Procedure:   TRANSURETHRAL RESECTION OF BLADDER TUMOR  Date of Surgery:  Clearance 09/14/23                                Surgeon:  DR. PATRICK McKENZIE Surgeon's Group or Practice Name:  [facility] Phone number:  [phone] Fax number:  [phone] : [name], CMA   Type of Clearance Requested:   - Medical  - Pharmacy:  Hold Apixaban (Eliquis) x 3 DAYS PRIOR AND 2 DAYS POST OP   Type of Anesthesia:  General    Additional requests/questions:    [name]   09/07/2023, 9:20 AM

## 2023-09-07 NOTE — Telephone Encounter (Signed)
 Patient with diagnosis of afib on Eliquis for anticoagulation.    Procedure: TRANSURETHRAL RESECTION OF BLADDER TUMOR  Date of procedure: 09/13/13   CHA2DS2-VASc Score = 3   This indicates a 3.2% annual risk of stroke. The patient's score is based upon: CHF History: 0 HTN History: 1 Diabetes History: 1 Stroke History: 0 Vascular Disease History: 0 Age Score: 1 Gender Score: 0      CrCl 61 ml/min Platelet count 217  Per office protocol, patient can hold Eliquis for 3 days prior to procedure.    **This guidance is not considered finalized until pre-operative APP has relayed final recommendations.**

## 2023-09-07 NOTE — Telephone Encounter (Signed)
 Tried to call the pt but no answer and no vm came on.

## 2023-09-07 NOTE — Telephone Encounter (Signed)
 Please advise holding Eliquis prior to transurethral resection of bladder tumor on 4/10.  Thank you!  DW

## 2023-09-08 ENCOUNTER — Ambulatory Visit: Attending: Emergency Medicine | Admitting: Emergency Medicine

## 2023-09-08 ENCOUNTER — Telehealth: Payer: Self-pay | Admitting: *Deleted

## 2023-09-08 DIAGNOSIS — Z01810 Encounter for preprocedural cardiovascular examination: Secondary | ICD-10-CM | POA: Diagnosis not present

## 2023-09-08 NOTE — Progress Notes (Signed)
 Virtual Visit via Telephone Note   Because of [facility] co-morbid illnesses, he is at least at moderate risk for complications without adequate follow up.  This format is felt to be most appropriate for this patient at this time.  Due to technical limitations with video connection (technology), today's appointment will be conducted as an audio only telehealth visit, and Patrick Herrera verbally agreed to proceed in this manner.   All issues noted in this document were discussed and addressed.  No physical exam could be performed with this format.  Evaluation Performed:  Preoperative cardiovascular risk assessment _____________   Date:  09/08/2023   Patient ID:  Patrick Herrera, DOB [birth year], MRN [id number] Patient Location:  Home Provider location:   Office  Primary Care Provider:  Hillery Aldo, NP Primary Cardiologist:  Dietrich Pates, MD  Chief Complaint / Patient Profile   68 y.o. y/o male with a h/o permanent atrial fibrillation, hypertension, hyperlipidemia, T2DM who is pending transurethral resection of bladder tumor on 09/14/2023 with Dr. Ronne Binning of [facility] [place] and presents today for telephonic preoperative cardiovascular risk assessment.  History of Present Illness    Patrick Herrera is a 68 y.o. male who presents via audio/video conferencing for a telehealth visit today.  Pt was last seen in cardiology clinic on 03/28/2023 by [name], PA.  At that time Patrick Herrera was doing well.  The patient is now pending procedure as outlined above. Since his last visit, he denies chest pain, shortness of breath, lower extremity edema, fatigue, palpitations, diaphoresis, weakness, presyncope, syncope, orthopnea, and PND.  Past Medical History    Past Medical History:  Diagnosis Date   Atrial fibrillation (HCC)    Chronic pain    GERD (gastroesophageal reflux disease)    Hyperlipidemia    Hypertension    Kidney stones    Pre-diabetes    Past Surgical History:   Procedure Laterality Date   bil knee surgery     COLONOSCOPY WITH PROPOFOL N/A 07/11/2023   Procedure: COLONOSCOPY WITH PROPOFOL;  Surgeon: Lanelle Bal, DO;  [facility]: [facility];  Service: Endoscopy;  Laterality: N/A;  1230pm, asa 2   ELBOW SURGERY     NECK SURGERY     x4   POLYPECTOMY  07/11/2023   Procedure: POLYPECTOMY;  Surgeon: Lanelle Bal, DO;  Location: [facility];  Service: Endoscopy;;   REVERSE SHOULDER ARTHROPLASTY Right 09/30/2021   Procedure: REVERSE SHOULDER ARTHROPLASTY;  Surgeon: Bjorn Pippin, MD;  Location: [facility];  Service: Orthopedics;  Laterality: Right;   SHOULDER SURGERY     x4    Allergies  Allergies  Allergen Reactions   Fentanyl Nausea And Vomiting   Oxycontin [Oxycodone Hcl] Nausea And Vomiting   Penicillins Nausea And Vomiting    Has patient had a PCN reaction causing immediate rash, facial/tongue/throat swelling, SOB or lightheadedness with hypotension: no Has patient had a PCN reaction causing severe rash involving mucus membranes or skin necrosis: no No Has patient had a PCN reaction that required hospitalization: No Has patient had a PCN reaction occurring within the last 10 years: No If all of the above answers are "NO", then may proceed with Cephalosporin use.     Home Medications    Prior to Admission medications   Medication Sig Start Date End Date Taking? Authorizing Provider  ALPRAZolam Prudy Feeler) 0.5 MG tablet Take 0.5 mg by mouth 2 (two) times daily. 02/28/23   [provider]  cephALEXin (KEFLEX) 500 MG capsule Take 1 capsule (500 mg total) by mouth 3 (three) times daily. 06/30/23   Gilda Crease, MD  dapagliflozin propanediol (FARXIGA) 10 MG TABS tablet Take 10 mg by mouth daily.    [provider]  ELIQUIS 5 MG TABS tablet Take 1 tablet by mouth twice daily 07/25/23   Iran Ouch, [place] M, PA-C  esomeprazole (NEXIUM) 40 MG capsule Take 40 mg by mouth daily at 12 noon.    [provider]  hydrochlorothiazide (MICROZIDE) 12.5 MG capsule Take 1 capsule by mouth once daily in the morning 07/25/23   Pricilla Riffle, MD  lisinopril (ZESTRIL) 5 MG tablet Take 1 tablet (5 mg total) by mouth daily. 05/06/22   Strader, Lennart Pall, PA-C  meclizine (ANTIVERT) 25 MG tablet Take 1 tablet (25 mg total) by mouth 3 (three) times daily as needed for dizziness. 06/30/23   Gilda Crease, MD  oxyCODONE-acetaminophen (PERCOCET) 7.5-325 MG tablet Take 1 tablet by mouth every 8 (eight) hours. 03/02/23   [provider]  propranolol ER (INDERAL LA) 160 MG SR capsule Take 1 capsule (160 mg total) by mouth daily. 05/06/22   Strader, Lennart Pall, PA-C  rosuvastatin (CRESTOR) 10 MG tablet Take 1 tablet (10 mg total) by mouth daily. 10/27/21   Pricilla Riffle, MD  tamsulosin (FLOMAX) 0.4 MG CAPS capsule Take 0.4 mg by mouth daily. 03/19/23   [provider]    Physical Exam    Vital Signs:  Patrick Herrera does not have vital signs available for review today.  Given telephonic nature of communication, physical exam is limited. AAOx3. NAD. Normal affect.  Speech and respirations are unlabored.  Accessory Clinical Findings    None  Assessment & Plan    1.  Preoperative Cardiovascular Risk Assessment: According to the Revised Cardiac Risk Index (RCRI), his Perioperative Risk of Major Cardiac Event is (%): 0.4. His Functional Capacity in METs is: 6.61 according to the Duke Activity Status Index (DASI). Therefore, based on ACC/AHA guidelines, patient would be at acceptable risk for the planned procedure without further cardiovascular testing.  The patient was advised that if he develops new symptoms prior to surgery to contact our office to arrange for a follow-up visit, and he verbalized understanding.  Per office protocol, patient can hold Eliquis for 3 days prior to procedure.    A copy of this note will be routed to requesting surgeon.  Time:   Today, I have spent 8  minutes with the patient with telehealth technology discussing medical history, symptoms, and management plan.     Denyce Robert, NP  09/08/2023, 1:58 PM

## 2023-09-08 NOTE — Telephone Encounter (Signed)
 Pt has been added today due to procedure date and med hold. Med rec and consent are done.      Patient Consent for Virtual Visit        TERYN GUST has provided verbal consent on 09/08/2023 for a virtual visit (video or telephone).   CONSENT FOR VIRTUAL VISIT FOR:  [name]  By participating in this virtual visit I agree to the following:  I hereby voluntarily request, consent and authorize [facility] and its employed or contracted physicians, physician assistants, nurse practitioners or other licensed health care professionals (the Practitioner), to provide me with telemedicine health care services (the "Services") as deemed necessary by the treating Practitioner. I acknowledge and consent to receive the Services by the Practitioner via telemedicine. I understand that the telemedicine visit will involve communicating with the Practitioner through live audiovisual communication technology and the disclosure of certain medical information by electronic transmission. I acknowledge that I have been given the opportunity to request an in-person assessment or other available alternative prior to the telemedicine visit and am voluntarily participating in the telemedicine visit.  I understand that I have the right to withhold or withdraw my consent to the use of telemedicine in the course of my care at any time, without affecting my right to future care or treatment, and that the Practitioner or I may terminate the telemedicine visit at any time. I understand that I have the right to inspect all information obtained and/or recorded in the course of the telemedicine visit and may receive copies of available information for a reasonable fee.  I understand that some of the potential risks of receiving the Services via telemedicine include:  Delay or interruption in medical evaluation due to technological equipment failure or disruption; Information transmitted may not be sufficient (e.g.  poor resolution of images) to allow for appropriate medical decision making by the Practitioner; and/or  In rare instances, security protocols could fail, causing a breach of personal health information.  Furthermore, I acknowledge that it is my responsibility to provide information about my medical history, conditions and care that is complete and accurate to the best of my ability. I acknowledge that Practitioner's advice, recommendations, and/or decision may be based on factors not within their control, such as incomplete or inaccurate data provided by me or distortions of diagnostic images or specimens that may result from electronic transmissions. I understand that the practice of medicine is not an exact science and that Practitioner makes no warranties or guarantees regarding treatment outcomes. I acknowledge that a copy of this consent can be made available to me via my patient portal [facility] MyChart), or I can request a printed copy by calling the office of [facility].    I understand that my insurance will be billed for this visit.   I have read or had this consent read to me. I understand the contents of this consent, which adequately explains the benefits and risks of the Services being provided via telemedicine.  I have been provided ample opportunity to ask questions regarding this consent and the Services and have had my questions answered to my satisfaction. I give my informed consent for the services to be provided through the use of telemedicine in my medical care

## 2023-09-08 NOTE — Telephone Encounter (Signed)
 Pt has been added today due to procedure date and med hold. Med rec and consent are done.

## 2023-09-08 NOTE — Telephone Encounter (Signed)
 2nd attempt to reach the pt. No answer or vm.

## 2023-09-08 NOTE — Telephone Encounter (Signed)
 Pt calling requesting cb.

## 2023-09-11 ENCOUNTER — Encounter (HOSPITAL_COMMUNITY)
Admission: RE | Admit: 2023-09-11 | Discharge: 2023-09-11 | Disposition: A | Discharge status: Home / Self Care | Source: Ambulatory Visit | Attending: Urology | Admitting: Urology

## 2023-09-11 ENCOUNTER — Encounter (HOSPITAL_COMMUNITY): Payer: Self-pay

## 2023-09-14 ENCOUNTER — Ambulatory Visit (HOSPITAL_BASED_OUTPATIENT_CLINIC_OR_DEPARTMENT_OTHER): Admitting: Anesthesiology

## 2023-09-14 ENCOUNTER — Encounter (HOSPITAL_COMMUNITY)
Admission: RE | Disposition: A | Discharge status: Home / Self Care | Payer: Self-pay | Source: Home / Self Care | Attending: Urology

## 2023-09-14 ENCOUNTER — Ambulatory Visit (HOSPITAL_COMMUNITY)
Admission: RE | Admit: 2023-09-14 | Discharge: 2023-09-14 | Disposition: A | Discharge status: Home / Self Care | Attending: Urology | Admitting: Urology

## 2023-09-14 ENCOUNTER — Ambulatory Visit (HOSPITAL_COMMUNITY): Admitting: Anesthesiology

## 2023-09-14 DIAGNOSIS — C679 Malignant neoplasm of bladder, unspecified: Secondary | ICD-10-CM

## 2023-09-14 DIAGNOSIS — C671 Malignant neoplasm of dome of bladder: Secondary | ICD-10-CM | POA: Diagnosis not present

## 2023-09-14 DIAGNOSIS — I10 Essential (primary) hypertension: Secondary | ICD-10-CM | POA: Diagnosis not present

## 2023-09-14 DIAGNOSIS — D494 Neoplasm of unspecified behavior of bladder: Secondary | ICD-10-CM

## 2023-09-14 DIAGNOSIS — R310 Gross hematuria: Secondary | ICD-10-CM | POA: Diagnosis not present

## 2023-09-14 HISTORY — PX: TRANSURETHRAL RESECTION OF BLADDER TUMOR: SHX2575

## 2023-09-14 HISTORY — PX: BLADDER INSTILLATION: SHX6893

## 2023-09-14 LAB — GLUCOSE, CAPILLARY: Glucose-Capillary: 87 mg/dL (ref 70–99)

## 2023-09-14 SURGERY — TURBT (TRANSURETHRAL RESECTION OF BLADDER TUMOR)
Anesthesia: General | Site: Bladder

## 2023-09-14 MED ORDER — ROCURONIUM BROMIDE 10 MG/ML (PF) SYRINGE
PREFILLED_SYRINGE | INTRAVENOUS | Status: DC | PRN
Start: 2023-09-14 — End: 2023-09-14
  Administered 2023-09-14: 60 mg via INTRAVENOUS

## 2023-09-14 MED ORDER — HYDROCODONE-ACETAMINOPHEN 5-325 MG PO TABS
1.0000 | ORAL_TABLET | Freq: Once | ORAL | Status: AC
  Administered 2023-09-14: 1 via ORAL

## 2023-09-14 MED ORDER — DEXMEDETOMIDINE HCL IN NACL 80 MCG/20ML IV SOLN
INTRAVENOUS | Status: AC
  Filled 2023-09-14: qty 40

## 2023-09-14 MED ORDER — MIDAZOLAM HCL 2 MG/2ML IJ SOLN
INTRAMUSCULAR | Status: AC
  Filled 2023-09-14: qty 2

## 2023-09-14 MED ORDER — ESMOLOL HCL 100 MG/10ML IV SOLN
INTRAVENOUS | Status: AC
  Filled 2023-09-14: qty 10

## 2023-09-14 MED ORDER — ESMOLOL HCL 100 MG/10ML IV SOLN
INTRAVENOUS | Status: DC | PRN
  Administered 2023-09-14: 10 mg via INTRAVENOUS
  Administered 2023-09-14 (×2): 20 mg via INTRAVENOUS

## 2023-09-14 MED ORDER — EPHEDRINE 5 MG/ML INJ
INTRAVENOUS | Status: AC
  Filled 2023-09-14: qty 10

## 2023-09-14 MED ORDER — STERILE WATER FOR IRRIGATION IR SOLN
Status: DC | PRN
  Administered 2023-09-14: 500 mL

## 2023-09-14 MED ORDER — CHLORHEXIDINE GLUCONATE 0.12 % MT SOLN: 15.0000 mL | Freq: Once | OROMUCOSAL | Status: DC

## 2023-09-14 MED ORDER — HYDROMORPHONE HCL 1 MG/ML IJ SOLN
0.2500 mg | INTRAMUSCULAR | Status: DC | PRN
Start: 2023-09-14 — End: 2023-09-14
  Administered 2023-09-14: .5 mg via INTRAVENOUS
  Administered 2023-09-14 (×2): 0.5 mg via INTRAVENOUS
  Administered 2023-09-14: .5 mg via INTRAVENOUS
  Filled 2023-09-14 (×4): qty 0.5

## 2023-09-14 MED ORDER — ONDANSETRON HCL 4 MG/2ML IJ SOLN
INTRAMUSCULAR | Status: DC | PRN
  Administered 2023-09-14: 4 mg via INTRAVENOUS

## 2023-09-14 MED ORDER — LACTATED RINGERS IV SOLN: INTRAVENOUS | Status: DC

## 2023-09-14 MED ORDER — HYDROCODONE-ACETAMINOPHEN 5-325 MG PO TABS: 1.0000 | ORAL_TABLET | Freq: Four times a day (QID) | ORAL | 0 refills | Status: AC | PRN

## 2023-09-14 MED ORDER — CEFAZOLIN SODIUM-DEXTROSE 2-4 GM/100ML-% IV SOLN
INTRAVENOUS | Status: AC
  Filled 2023-09-14: qty 100

## 2023-09-14 MED ORDER — FENTANYL CITRATE (PF) 100 MCG/2ML IJ SOLN
INTRAMUSCULAR | Status: DC | PRN
  Administered 2023-09-14: 100 ug via INTRAVENOUS

## 2023-09-14 MED ORDER — ACETAMINOPHEN 10 MG/ML IV SOLN
1000.0000 mg | Freq: Once | INTRAVENOUS | Status: DC | PRN
  Administered 2023-09-14: 1000 mg via INTRAVENOUS
  Filled 2023-09-14: qty 100

## 2023-09-14 MED ORDER — GLYCOPYRROLATE PF 0.2 MG/ML IJ SOSY
PREFILLED_SYRINGE | INTRAMUSCULAR | Status: AC
  Filled 2023-09-14: qty 1

## 2023-09-14 MED ORDER — DEXMEDETOMIDINE HCL IN NACL 80 MCG/20ML IV SOLN
INTRAVENOUS | Status: DC | PRN
  Administered 2023-09-14: 20 ug via INTRAVENOUS

## 2023-09-14 MED ORDER — SODIUM CHLORIDE (PF) 0.9 % IJ SOLN
INTRAVENOUS | Status: DC | PRN
  Administered 2023-09-14: 1000 mg via INTRAVESICAL

## 2023-09-14 MED ORDER — KETAMINE HCL 10 MG/ML IJ SOLN
INTRAMUSCULAR | Status: DC | PRN
  Administered 2023-09-14: 30 mg via INTRAVENOUS
  Administered 2023-09-14: 20 mg via INTRAVENOUS

## 2023-09-14 MED ORDER — CEFAZOLIN SODIUM-DEXTROSE 2-4 GM/100ML-% IV SOLN
2.0000 g | INTRAVENOUS | Status: AC
  Administered 2023-09-14: 2 g via INTRAVENOUS

## 2023-09-14 MED ORDER — KETAMINE HCL 50 MG/5ML IJ SOSY
PREFILLED_SYRINGE | INTRAMUSCULAR | Status: AC
  Filled 2023-09-14: qty 5

## 2023-09-14 MED ORDER — SODIUM CHLORIDE 0.9 % IR SOLN
Status: DC | PRN
  Administered 2023-09-14: 3000 mL

## 2023-09-14 MED ORDER — GEMCITABINE CHEMO FOR BLADDER INSTILLATION 2000 MG
2000.0000 mg | Freq: Once | INTRAVENOUS | Status: DC
  Filled 2023-09-14: qty 52.6

## 2023-09-14 MED ORDER — PHENYLEPHRINE 80 MCG/ML (10ML) SYRINGE FOR IV PUSH (FOR BLOOD PRESSURE SUPPORT)
PREFILLED_SYRINGE | INTRAVENOUS | Status: AC
  Filled 2023-09-14: qty 20

## 2023-09-14 MED ORDER — LIDOCAINE HCL (PF) 2 % IJ SOLN
INTRAMUSCULAR | Status: AC
  Filled 2023-09-14: qty 10

## 2023-09-14 MED ORDER — HYDROCODONE-ACETAMINOPHEN 5-325 MG PO TABS
ORAL_TABLET | ORAL | Status: AC
  Filled 2023-09-14: qty 1

## 2023-09-14 MED ORDER — PROPOFOL 10 MG/ML IV BOLUS
INTRAVENOUS | Status: DC | PRN
Start: 2023-09-14 — End: 2023-09-14
  Administered 2023-09-14: 200 mg via INTRAVENOUS

## 2023-09-14 MED ORDER — ORAL CARE MOUTH RINSE: 15.0000 mL | Freq: Once | OROMUCOSAL | Status: DC

## 2023-09-14 MED ORDER — FENTANYL CITRATE (PF) 100 MCG/2ML IJ SOLN
INTRAMUSCULAR | Status: AC
  Filled 2023-09-14: qty 2

## 2023-09-14 MED ORDER — ROCURONIUM BROMIDE 10 MG/ML (PF) SYRINGE
PREFILLED_SYRINGE | INTRAVENOUS | Status: AC
Start: 2023-09-14 — End: ?
  Filled 2023-09-14: qty 20

## 2023-09-14 MED ORDER — PHENYLEPHRINE 80 MCG/ML (10ML) SYRINGE FOR IV PUSH (FOR BLOOD PRESSURE SUPPORT)
PREFILLED_SYRINGE | INTRAVENOUS | Status: DC | PRN
Start: 2023-09-14 — End: 2023-09-14
  Administered 2023-09-14 (×4): 160 ug via INTRAVENOUS

## 2023-09-14 MED ORDER — ONDANSETRON HCL 4 MG/2ML IJ SOLN: 4.0000 mg | Freq: Once | INTRAMUSCULAR | Status: DC | PRN

## 2023-09-14 MED ORDER — DEXAMETHASONE SODIUM PHOSPHATE 10 MG/ML IJ SOLN
INTRAMUSCULAR | Status: AC
  Filled 2023-09-14: qty 2

## 2023-09-14 MED ORDER — SUGAMMADEX SODIUM 200 MG/2ML IV SOLN
INTRAVENOUS | Status: AC
  Filled 2023-09-14: qty 8

## 2023-09-14 MED ORDER — SUGAMMADEX SODIUM 200 MG/2ML IV SOLN
INTRAVENOUS | Status: DC | PRN
  Administered 2023-09-14: 400 mg via INTRAVENOUS

## 2023-09-14 MED ORDER — ONDANSETRON HCL 4 MG/2ML IJ SOLN
INTRAMUSCULAR | Status: AC
  Filled 2023-09-14: qty 8

## 2023-09-14 SURGICAL SUPPLY — 20 items
BAG DRAIN URO TABLE W/ADPT NS (BAG) ×1 IMPLANT
BAG HAMPER (MISCELLANEOUS) ×1 IMPLANT
BAG URINE DRAIN 2000ML AR STRL (UROLOGICAL SUPPLIES) ×1 IMPLANT
CATH FOLEY 3WAY 30CC 22F (CATHETERS) IMPLANT
CLOTH BEACON ORANGE TIMEOUT ST (SAFETY) ×1 IMPLANT
ELECT LOOP 22F BIPOLAR SML (ELECTROSURGICAL) ×1 IMPLANT
ELECTRODE LOOP 22F BIPOLAR SML (ELECTROSURGICAL) ×1 IMPLANT
GLOVE BIO SURGEON STRL SZ8 (GLOVE) ×1 IMPLANT
GLOVE BIOGEL PI IND STRL 7.0 (GLOVE) ×2 IMPLANT
GOWN STRL REUS W/TWL LRG LVL3 (GOWN DISPOSABLE) ×1 IMPLANT
GOWN STRL REUS W/TWL XL LVL3 (GOWN DISPOSABLE) ×1 IMPLANT
KIT CHEMO SPILL (MISCELLANEOUS) ×1 IMPLANT
KIT TURNOVER CYSTO (KITS) ×1 IMPLANT
PACK CYSTO (CUSTOM PROCEDURE TRAY) ×1 IMPLANT
PAD ARMBOARD POSITIONER FOAM (MISCELLANEOUS) ×1 IMPLANT
PLUG CATH AND CAP STER (CATHETERS) IMPLANT
POSITIONER HEAD 8X9X4 ADT (SOFTGOODS) ×1 IMPLANT
SOL .9 NS 3000ML IRR UROMATIC (IV SOLUTION) ×2 IMPLANT
SYR 30ML LL (SYRINGE) ×1 IMPLANT
TOWEL OR 17X26 4PK STRL BLUE (TOWEL DISPOSABLE) ×1 IMPLANT

## 2023-09-14 NOTE — Op Note (Signed)
.  Preoperative diagnosis: bladder tumor  Postoperative diagnosis: Same  Procedure: 1 cystoscopy 2.. Transurethral resection of bladder tumor, large 3. 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 3cm dome tumor.  Ureteral orifices in normal anatomic location.  Indications: Patient is a 68 year old male with a history of bladder tumor and gross hematuria.  After discussing treatment options, they decided proceed with transurethral resection of a bladder tumor.  Procedure in detail: The patient was brought to the operating room and a brief timeout was done to ensure correct patient, correct procedure, correct site.  General anesthesia was administered patient was placed in dorsal lithotomy position.  Their genitalia was then prepped and draped in usual sterile fashion.  A rigid 22 French cystoscope was passed in the urethra and the bladder.  Bladder was inspected and we noted a 6cm and a 3cm bladder tumor.  the ureteral orifices were in the normal orthotopic locations.   We then removed the cystoscope and placed a resectoscope into the bladder.  Using the bipolar resectoscope we removed the bladder tumors down to the base.  Hemostasis was then obtained with electrocautery. We then removed the bladder tumor chips and sent them for pathology. We then re-inspected the bladder and found no residual bleeding.  the bladder was then drained, a 22 French foley was placed and 2g of gemcitabine was instilled into the bladder. This concluded the procedure which was well tolerated by patient.  Complications: None  Condition: Stable, extubated, transferred to PACU  Plan: Patient is to have his foley drained in 1 hour. He will be discharged home and followup in 7 days for foley  catheter removal and pathology discussion.

## 2023-09-14 NOTE — Anesthesia Preprocedure Evaluation (Signed)
Anesthesia Evaluation  Patient identified by MRN, date of birth, ID band Patient awake    Reviewed: Allergy & Precautions, H&P , NPO status , Patient's Chart, lab work & pertinent test results, reviewed documented beta blocker date and time   Airway Mallampati: II  TM Distance: >3 FB Neck ROM: full    Dental no notable dental hx.    Pulmonary neg pulmonary ROS   Pulmonary exam normal breath sounds clear to auscultation       Cardiovascular Exercise Tolerance: Good hypertension, negative cardio ROS  Rhythm:regular Rate:Normal     Neuro/Psych negative neurological ROS  negative psych ROS   GI/Hepatic negative GI ROS, Neg liver ROS,,,  Endo/Other  negative endocrine ROS    Renal/GU negative Renal ROS  negative genitourinary   Musculoskeletal   Abdominal   Peds  Hematology negative hematology ROS (+)   Anesthesia Other Findings   Reproductive/Obstetrics negative OB ROS                             Anesthesia Physical Anesthesia Plan  ASA: 2  Anesthesia Plan: General and General LMA   Post-op Pain Management:    Induction:   PONV Risk Score and Plan: Ondansetron  Airway Management Planned:   Additional Equipment:   Intra-op Plan:   Post-operative Plan:   Informed Consent: I have reviewed the patients History and Physical, chart, labs and discussed the procedure including the risks, benefits and alternatives for the proposed anesthesia with the patient or authorized representative who has indicated his/her understanding and acceptance.     Dental Advisory Given  Plan Discussed with: CRNA  Anesthesia Plan Comments:        Anesthesia Quick Evaluation

## 2023-09-14 NOTE — Anesthesia Procedure Notes (Signed)
 Procedure Name: Intubation Date/Time: 09/14/2023 1:53 PM  Performed by: Shanon Payor, CRNAPre-anesthesia Checklist: Patient identified, Emergency Drugs available, Suction available, Patient being monitored and Timeout performed Patient Re-evaluated:Patient Re-evaluated prior to induction Oxygen Delivery Method: Circle system utilized Preoxygenation: Pre-oxygenation with 100% oxygen Induction Type: IV induction Ventilation: Mask ventilation without difficulty Laryngoscope Size: Mac and 3 Grade View: Grade I Tube type: Oral Number of attempts: 1 Airway Equipment and Method: Stylet Placement Confirmation: ETT inserted through vocal cords under direct vision, positive ETCO2, CO2 detector and breath sounds checked- equal and bilateral Secured at: 22 cm Tube secured with: Tape Dental Injury: Teeth and Oropharynx as per pre-operative assessment

## 2023-09-14 NOTE — Transfer of Care (Signed)
 Immediate Anesthesia Transfer of Care Note  Patient: Patrick Herrera  Procedure(s) Performed: TURBT (TRANSURETHRAL RESECTION OF BLADDER TUMOR) (Bladder) INSTILLATION, BLADDER (Bladder)  Patient Location: PACU  Anesthesia Type:General  Level of Consciousness: awake, alert , oriented, and pateint uncooperative  Airway & Oxygen Therapy: Patient Spontanous Breathing and Patient connected to face mask oxygen  Post-op Assessment: Report given to RN, Post -op Vital signs reviewed and stable, and Patient moving all extremities X 4  Post vital signs: Reviewed and stable  Last Vitals:  Vitals Value Taken Time  BP 121/93 09/14/23 1500  Temp    Pulse 142 09/14/23 1501  Resp 19 09/14/23 1501  SpO2 100 % 09/14/23 1501  Vitals shown include unfiled device data.  Last Pain:  Vitals:   09/14/23 1149  TempSrc: Oral  PainSc: 10-Worst pain ever      Patients Stated Pain Goal: 6 (09/14/23 1149)  Complications: No notable events documented.

## 2023-09-14 NOTE — Interval H&P Note (Signed)
 History and Physical Interval Note:  09/14/2023 1:26 PM  Patrick Herrera  has presented today for surgery, with the diagnosis of Bladder Tumor.  The various methods of treatment have been discussed with the patient and family. After consideration of risks, benefits and other options for treatment, the patient has consented to  Procedure(s) with comments: TURBT (TRANSURETHRAL RESECTION OF BLADDER TUMOR) (N/A) INSTILLATION, BLADDER (N/A) - Gemcitabine as a surgical intervention.  The patient's history has been reviewed, patient examined, no change in status, stable for surgery.  I have reviewed the patient's chart and labs.  Questions were answered to the patient's satisfaction.     [name]

## 2023-09-15 ENCOUNTER — Encounter (HOSPITAL_COMMUNITY): Payer: Self-pay | Admitting: Urology

## 2023-09-18 LAB — SURGICAL PATHOLOGY

## 2023-09-20 ENCOUNTER — Encounter: Payer: Self-pay | Admitting: Urology

## 2023-09-20 ENCOUNTER — Ambulatory Visit: Admitting: Urology

## 2023-09-21 ENCOUNTER — Encounter (HOSPITAL_COMMUNITY): Payer: Self-pay | Admitting: *Deleted

## 2023-09-21 ENCOUNTER — Emergency Department (HOSPITAL_COMMUNITY)
Admission: EM | Admit: 2023-09-21 | Discharge: 2023-09-21 | Disposition: A | Discharge status: Against Medical Advice | Attending: Emergency Medicine | Admitting: Emergency Medicine

## 2023-09-21 ENCOUNTER — Other Ambulatory Visit: Payer: Self-pay

## 2023-09-21 DIAGNOSIS — R300 Dysuria: Secondary | ICD-10-CM | POA: Diagnosis not present

## 2023-09-21 DIAGNOSIS — Z7901 Long term (current) use of anticoagulants: Secondary | ICD-10-CM | POA: Insufficient documentation

## 2023-09-21 DIAGNOSIS — Z466 Encounter for fitting and adjustment of urinary device: Secondary | ICD-10-CM | POA: Diagnosis present

## 2023-09-21 DIAGNOSIS — I4891 Unspecified atrial fibrillation: Secondary | ICD-10-CM | POA: Insufficient documentation

## 2023-09-21 DIAGNOSIS — R102 Pelvic and perineal pain: Secondary | ICD-10-CM | POA: Diagnosis not present

## 2023-09-21 DIAGNOSIS — Z978 Presence of other specified devices: Secondary | ICD-10-CM

## 2023-09-21 LAB — CBC WITH DIFFERENTIAL/PLATELET
Abs Immature Granulocytes: 0.03 10*3/uL (ref 0.00–0.07)
Basophils Absolute: 0 10*3/uL (ref 0.0–0.1)
Basophils Relative: 0 %
Eosinophils Absolute: 0.1 10*3/uL (ref 0.0–0.5)
Eosinophils Relative: 1 %
HCT: 36.9 % — ABNORMAL LOW (ref 39.0–52.0)
Hemoglobin: 12.2 g/dL — ABNORMAL LOW (ref 13.0–17.0)
Immature Granulocytes: 1 %
Lymphocytes Relative: 16 %
Lymphs Abs: 1 10*3/uL (ref 0.7–4.0)
MCH: 27.9 pg (ref 26.0–34.0)
MCHC: 33.1 g/dL (ref 30.0–36.0)
MCV: 84.4 fL (ref 80.0–100.0)
Monocytes Absolute: 0.4 10*3/uL (ref 0.1–1.0)
Monocytes Relative: 7 %
Neutro Abs: 4.7 10*3/uL (ref 1.7–7.7)
Neutrophils Relative %: 75 %
Platelets: 171 10*3/uL (ref 150–400)
RBC: 4.37 MIL/uL (ref 4.22–5.81)
RDW: 14.1 % (ref 11.5–15.5)
WBC: 6.2 10*3/uL (ref 4.0–10.5)
nRBC: 0 % (ref 0.0–0.2)

## 2023-09-21 LAB — BASIC METABOLIC PANEL WITH GFR
Anion gap: 11 (ref 5–15)
BUN: 13 mg/dL (ref 8–23)
CO2: 30 mmol/L (ref 22–32)
Calcium: 9.9 mg/dL (ref 8.9–10.3)
Chloride: 99 mmol/L (ref 98–111)
Creatinine, Ser: 1.01 mg/dL (ref 0.61–1.24)
GFR, Estimated: >60 mL/min
Glucose, Bld: 103 mg/dL — ABNORMAL HIGH (ref 70–99)
Potassium: 4.2 mmol/L (ref 3.5–5.1)
Sodium: 140 mmol/L (ref 135–145)

## 2023-09-21 LAB — URINALYSIS, W/ REFLEX TO CULTURE (INFECTION SUSPECTED): RBC / HPF: >50 RBC/hpf (ref 0–5)

## 2023-09-21 MED ORDER — TRAMADOL HCL 50 MG PO TABS
50.0000 mg | ORAL_TABLET | Freq: Once | ORAL | Status: AC
  Administered 2023-09-21: 50 mg via ORAL
  Filled 2023-09-21: qty 1

## 2023-09-21 MED ORDER — CEPHALEXIN 500 MG PO CAPS: 500.0000 mg | ORAL_CAPSULE | Freq: Four times a day (QID) | ORAL | 0 refills | Status: AC

## 2023-09-21 MED ORDER — CEPHALEXIN 500 MG PO CAPS: 500.0000 mg | ORAL_CAPSULE | Freq: Two times a day (BID) | ORAL | 0 refills | Status: AC

## 2023-09-21 NOTE — Discharge Instructions (Addendum)
 If you have bladder pain or you can't pee come back to ER.  I have started you on antibiotic, please take as prescribed.   You need to follow up with prostate and bladder doctor. The doctor's name is Dr Claretta Croft. You need to call them and you need to see them in the office.

## 2023-09-21 NOTE — ED Notes (Signed)
 Pt walked out before getting his AMA paperwork, PA advised him again about returning if unable to void and beginning antibiotics to pick up at pharmacy

## 2023-09-21 NOTE — ED Notes (Signed)
 Pt is

## 2023-09-21 NOTE — ED Notes (Signed)
 Pt is requesting to take foley cath that was removed home to show his attorney, advised pt that this was discarded.  Pt pulls foley cath out of trash to take home with him

## 2023-09-21 NOTE — ED Notes (Signed)
 Pt voided in bathroom x1 after foley removal.

## 2023-09-21 NOTE — ED Provider Notes (Signed)
 [place] EMERGENCY DEPARTMENT AT [facility] Provider Note   CSN: [id number] Arrival date & time: 09/21/23  1509     History  Chief Complaint  Patient presents with   Pain    Foley    Patrick Herrera is a 68 y.o. male.  With bladder tumor, hyperlipidemia, intermittent A-fib on Eliquis  is presenting to the emergency room with complaint of dysuria and penile pain around Foley.  Patient is 7 days postop TURP.  He had large bladder tumor removed.  He reports he thinks it was cancer.  He was discharged with a Foley catheter in place but reports that he was frustrated with his urologist team and so he did not follow-up.  He has now complaining of severe penile pain and wants his Foley catheter removed immediately.  Denies any fevers, chills, suprapubic pain, abdominal pain nausea vomiting or diarrhea.  HPI     Home Medications Prior to Admission medications   Medication Sig Start Date End Date Taking? Authorizing Provider  dapagliflozin propanediol (FARXIGA) 10 MG TABS tablet Take 10 mg by mouth daily.    [provider]  ELIQUIS  5 MG TABS tablet Take 1 tablet by mouth twice daily 07/25/23   Strader, [place] M, PA-C  esomeprazole (NEXIUM) 40 MG capsule Take 40 mg by mouth daily at 12 noon.    [provider]  hydrochlorothiazide  (MICROZIDE ) 12.5 MG capsule Take 1 capsule by mouth once daily in the morning 07/25/23   Elmyra Haggard, MD  lisinopril  (ZESTRIL ) 5 MG tablet Take 1 tablet (5 mg total) by mouth daily. Patient not taking: Reported on 09/11/2023 05/06/22   Strader, Brittany M, PA-C  meclizine  (ANTIVERT ) 25 MG tablet Take 1 tablet (25 mg total) by mouth 3 (three) times daily as needed for dizziness. Patient not taking: Reported on 09/11/2023 06/30/23   Ballard Bongo, MD  rosuvastatin  (CRESTOR ) 10 MG tablet Take 1 tablet (10 mg total) by mouth daily. 10/27/21   Elmyra Haggard, MD  tamsulosin (FLOMAX) 0.4 MG CAPS capsule Take 0.4 mg by mouth daily. 03/19/23    [provider]      Allergies    Fentanyl , Oxycontin  [oxycodone  hcl], and Penicillins    Review of Systems   Review of Systems  Genitourinary:  Positive for dysuria, penile pain and urgency.    Physical Exam Updated Vital Signs BP 127/78 (BP Location: Right Arm)   Pulse (!) 113   Temp 98.6 F (37 C)   Resp 20   SpO2 100%  Physical Exam Vitals and nursing note reviewed.  Constitutional:      General: He is not in acute distress.    Appearance: He is not toxic-appearing.  HENT:     Head: Normocephalic and atraumatic.  Eyes:     General: No scleral icterus.    Conjunctiva/sclera: Conjunctivae normal.  Cardiovascular:     Rate and Rhythm: Normal rate and regular rhythm.     Pulses: Normal pulses.     Heart sounds: Normal heart sounds.  Pulmonary:     Effort: Pulmonary effort is normal. No respiratory distress.     Breath sounds: Normal breath sounds.  Abdominal:     General: Abdomen is flat. Bowel sounds are normal.     Palpations: Abdomen is soft.     Tenderness: There is no abdominal tenderness.  Musculoskeletal:     Right lower leg: No edema.     Left lower leg: No edema.  Skin:  General: Skin is warm and dry.     Findings: No lesion.  Neurological:     General: No focal deficit present.     Mental Status: He is alert and oriented to person, place, and time. Mental status is at baseline.     ED Results / Procedures / Treatments   Labs (all labs ordered are listed, but only abnormal results are displayed) Labs Reviewed  URINALYSIS, W/ REFLEX TO CULTURE (INFECTION SUSPECTED) - Abnormal; Notable for the following components:      Result Value   Color, Urine RED (*)    APPearance TURBID (*)    Glucose, UA   (*)    Value: TEST NOT REPORTED DUE TO COLOR INTERFERENCE OF URINE PIGMENT   Hgb urine dipstick   (*)    Value: TEST NOT REPORTED DUE TO COLOR INTERFERENCE OF URINE PIGMENT   Bilirubin Urine   (*)    Value: TEST NOT REPORTED DUE TO COLOR  INTERFERENCE OF URINE PIGMENT   Ketones, ur   (*)    Value: TEST NOT REPORTED DUE TO COLOR INTERFERENCE OF URINE PIGMENT   Protein, ur   (*)    Value: TEST NOT REPORTED DUE TO COLOR INTERFERENCE OF URINE PIGMENT   Nitrite   (*)    Value: TEST NOT REPORTED DUE TO COLOR INTERFERENCE OF URINE PIGMENT   Leukocytes,Ua   (*)    Value: TEST NOT REPORTED DUE TO COLOR INTERFERENCE OF URINE PIGMENT   Bacteria, UA RARE (*)    All other components within normal limits  CBC WITH DIFFERENTIAL/PLATELET - Abnormal; Notable for the following components:   Hemoglobin 12.2 (*)    HCT 36.9 (*)    All other components within normal limits  BASIC METABOLIC PANEL WITH GFR    EKG None  Radiology No results found.  Procedures Procedures    Medications Ordered in ED Medications  traMADol  (ULTRAM ) tablet 50 mg (50 mg Oral Given 09/21/23 1554)    ED Course/ Medical Decision Making/ A&P Clinical Course as of 09/21/23 1657  Thu Sep 21, 2023  1624 Patient is demanding that we remove foley immediately.  Discussed that he needed to follow-up with urology to have Foley catheter removed patient discussed the risks of urinary retention and urology should follow for removal.  Patient expresses understanding and still wants the Foley removed, thus Foley was removed in triage.  [JB]  [id number] Patient reports his car is blocking all of the ER parking lot entrance, so he needs to leave and move his care... [JB]    Clinical Course User Index [JB] Jerimiah Wolman, Kandace Organ, PA-C                                 Medical Decision Making Amount and/or Complexity of Data Reviewed Labs: ordered.  Risk Prescription drug management.   This patient presents to the ED for concern of foley pain, this involves an extensive number of treatment options, and is a complaint that carries with it a high risk of complications and morbidity.  The differential diagnosis includes urinary tract infection, urinary retention, Foley  dysfunction   Co morbidities that complicate the patient evaluation  Bladder tumor Afib on Eliquis     Additional history obtained:  Additional history obtained from via postop now 09/14/2023 with Dr. Claretta Croft with urology   Lab Tests:  I personally interpreted labs.  The pertinent results include:   CBC without  leukocytosis.  Hemoglobin is 12.2 BMP wnl Urinalysis is positive for gross hematuria.  Rare bacteria will send for culture and start on Keflex  prophylactically    Problem List / ED Course / Critical interventions / Medication management  Patient reporting to emergency room with Foley problem.  Patient reports that he wants his Foley taken out.  Patient was adamant and screaming at staff thus Foley catheter was removed after further discussion.  His urine is positive for significant hematuria.  His hemoglobin is 12.2 urine culture is sent.  He is overall hemodynamically stable and well-appearing.  He is requesting discharge.  Discussed the importance of follow-up with urology patient expressed understanding.  I ordered medication including Tramadol  for pain control  Reevaluation of the patient after these medicines showed that the patient improved I have reviewed the patients home medicines and have made adjustments as needed   Plan  F/u w/ PCP in 2-3d to ensure resolution of sx.  Patient was given return precautions. Patient stable for discharge at this time.  Patient educated on sx/dx and verbalized understanding of plan. Return to ER w/ new or worsening sx.          Final Clinical Impression(s) / ED Diagnoses Final diagnoses:  Foley catheter in place on admission    Rx / DC Orders ED Discharge Orders     None         Emanuelle Hammerstrom, Kandace Organ, PA-C 09/21/23 1940    Teddi Favors, DO 09/22/23 [phone]

## 2023-09-21 NOTE — ED Triage Notes (Signed)
 Pt is here due to pain in penis from foley cath.  Pt has foley in place and it is draining rose colored urine.  Pt is requesting foley cath be removed.

## 2023-09-21 NOTE — ED Notes (Signed)
 Pt given ice water-po intake encouraged.

## 2023-09-21 NOTE — ED Notes (Addendum)
 Pt is insistent that his foley be removed.  Advised pt that foley should remain until follow up with urology.  PA and myself discussed the risks of removal before follow up with urology and advised him that if he can not void after removal he needs to have foley replaced right away.

## 2023-09-21 NOTE — ED Provider Triage Note (Cosign Needed Addendum)
 Emergency Medicine Provider Triage Evaluation Note  Patrick Herrera , a 68 y.o. male  was evaluated in triage.  Pt complains of penile pain, dysuria.  Patient is status post TURP 7 days ago.  Is not been able to follow-up. Did not deny suprapubic pain, urinary retention or fever  Review of Systems  Positive: Foley pain Negative: Suprapubic pain  Physical Exam  There were no vitals taken for this visit. Gen:   Awake, no distress   Resp:  Normal effort  MSK:   Moves extremities without difficulty  Other:  Foley in place draining blood-tinged urine with very small and frequent blood clot. Appears to be draining.   Medical Decision Making  Medically screening exam initiated at 3:41 PM.  Appropriate orders placed.  EVERRETT LACASSE was informed that the remainder of the evaluation will be completed by another provider, this initial triage assessment does not replace that evaluation, and the importance of remaining in the ED until their evaluation is complete.  Will pain control. Will bladder scan.      Eudora Heron, PA-C 09/21/23 1542    Eudora Heron, PA-C 09/21/23 1544

## 2023-09-25 NOTE — Anesthesia Postprocedure Evaluation (Signed)
 Anesthesia Post Note  Patient: Patrick Herrera  Procedure(s) Performed: TURBT (TRANSURETHRAL RESECTION OF BLADDER TUMOR) (Bladder) INSTILLATION, BLADDER (Bladder)  Patient location during evaluation: Phase II Anesthesia Type: General Level of consciousness: awake Pain management: pain level controlled Vital Signs Assessment: post-procedure vital signs reviewed and stable Respiratory status: spontaneous breathing and respiratory function stable Cardiovascular status: blood pressure returned to baseline and stable Postop Assessment: no headache and no apparent nausea or vomiting Anesthetic complications: no Comments: Late entry   No notable events documented.   Last Vitals:  Vitals:   09/14/23 1550 09/14/23 1640  BP:  98/64  Pulse: 75 83  Resp: 15 12  Temp:  (!) 36.3 C  SpO2: 99% 100%    Last Pain:  Vitals:   09/15/23 1353  TempSrc:   PainSc: 4                  Coretha Dew

## 2023-10-26 ENCOUNTER — Encounter (HOSPITAL_COMMUNITY): Payer: Self-pay | Admitting: Urology

## 2023-12-01 ENCOUNTER — Ambulatory Visit: Attending: Internal Medicine | Admitting: Internal Medicine

## 2023-12-01 ENCOUNTER — Encounter: Payer: Self-pay | Admitting: Internal Medicine

## 2023-12-01 VITALS — BP 98/62 | HR 92 | Ht 68.0 in | Wt 182.4 lb

## 2023-12-01 DIAGNOSIS — I4819 Other persistent atrial fibrillation: Secondary | ICD-10-CM | POA: Diagnosis not present

## 2023-12-01 NOTE — Patient Instructions (Signed)
 Medication Instructions:   Your physician recommends that you continue on your current medications as directed. Please refer to the Current Medication list given to you today.   Labwork: None today  Testing/Procedures: None today  Follow-Up: 9 months Dr.Ross  Any Other Special Instructions Will Be Listed Below (If Applicable).  If you need a refill on your cardiac medications before your next appointment, please call your pharmacy.

## 2023-12-01 NOTE — Progress Notes (Signed)
 Cardiology Office Note    Date:  12/01/2023   ID:  HAYTHAM MAHER, DOB May 07, 1956, MRN [id number]  PCP:  Campbell Reynolds, NP  Cardiologist: Vina Gull, MD    Pt presents  for follow up      History of Present Illness:    Patrick Herrera is a 68 y.o. male with past medical history of permanent atrial fibrillation, HTN, HLD and Type II DM   April 2022  Dx with atrial fibrillation.     Placed on propranolol  and Eliquis    Echo and GXT ordered   Neither done  Nov 2022   Myoview  done  Low risk   Possible mild apical inferolateral ischemia   LVEF 69%    Nov 2022  Echo   LVEF and RVEF normal    Atria upper normal Monitor   100% afib   Average HR 77 bpm  Oct 2024   PT with CP   Stabbing   Eased with Xanax       The pt says since seen he has been doing good  No palpitations   No dizziness   No CP   Breathing is OK     He is fairly active         Past Medical History:  Diagnosis Date   Atrial fibrillation (HCC)    Chronic pain    GERD (gastroesophageal reflux disease)    Hyperlipidemia    Hypertension    Kidney stones    Pre-diabetes     Past Surgical History:  Procedure Laterality Date   bil knee surgery     BLADDER INSTILLATION N/A 09/14/2023   Procedure: INSTILLATION, BLADDER;  Surgeon: Sherrilee Belvie CROME, MD;  Location: [facility];  Service: Urology;  Laterality: N/A;  Gemcitabine    COLONOSCOPY WITH PROPOFOL  N/A 07/11/2023   Procedure: COLONOSCOPY WITH PROPOFOL ;  Surgeon: Cindie Carlin POUR, DO;  Location: [facility];  Service: Endoscopy;  Laterality: N/A;  1230pm, asa 2   ELBOW SURGERY     NECK SURGERY     x4   POLYPECTOMY  07/11/2023   Procedure: POLYPECTOMY;  Surgeon: Cindie Carlin POUR, DO;  Location: [facility];  Service: Endoscopy;;   REVERSE SHOULDER ARTHROPLASTY Right 09/30/2021   Procedure: REVERSE SHOULDER ARTHROPLASTY;  Surgeon: Cristy Bonner DASEN, MD;  Location:  SURGERY CENTER;  Service: Orthopedics;  Laterality: Right;   SHOULDER SURGERY     x4    TRANSURETHRAL RESECTION OF BLADDER TUMOR N/A 09/14/2023   Procedure: TURBT (TRANSURETHRAL RESECTION OF BLADDER TUMOR);  Surgeon: Sherrilee Belvie CROME, MD;  Location: [facility];  Service: Urology;  Laterality: N/A;    Current Medications: Outpatient Medications Prior to Visit  Medication Sig Dispense Refill   dapagliflozin propanediol (FARXIGA) 10 MG TABS tablet Take 10 mg by mouth daily.     ELIQUIS  5 MG TABS tablet Take 1 tablet by mouth twice daily 180 tablet 1   esomeprazole (NEXIUM) 40 MG capsule Take 40 mg by mouth daily at 12 noon.     hydrochlorothiazide  (MICROZIDE ) 12.5 MG capsule Take 1 capsule by mouth once daily in the morning 90 capsule 2   lisinopril  (ZESTRIL ) 5 MG tablet Take 1 tablet (5 mg total) by mouth daily. 90 tablet 3   rosuvastatin  (CRESTOR ) 10 MG tablet Take 1 tablet (10 mg total) by mouth daily. 90 tablet 3   tamsulosin (FLOMAX) 0.4 MG CAPS capsule Take 0.4 mg by mouth daily.     cephALEXin  (KEFLEX ) 500 MG capsule Take  1 capsule (500 mg total) by mouth 4 (four) times daily. (Patient not taking: Reported on 12/01/2023) 20 capsule 0   cephALEXin  (KEFLEX ) 500 MG capsule Take 1 capsule (500 mg total) by mouth 2 (two) times daily. (Patient not taking: Reported on 12/01/2023) 10 capsule 0   meclizine  (ANTIVERT ) 25 MG tablet Take 1 tablet (25 mg total) by mouth 3 (three) times daily as needed for dizziness. (Patient not taking: Reported on 12/01/2023) 30 tablet 0   No facility-administered medications prior to visit.     Allergies:   Fentanyl , Oxycontin  [oxycodone  hcl], and Penicillins   Social History   Socioeconomic History   Marital status: Divorced    Spouse name: Not on file   Number of children: Not on file   Years of education: Not on file   Highest education level: Not on file  Occupational History   Not on file  Tobacco Use   Smoking status: Never   Smokeless tobacco: Never  Vaping Use   Vaping status: Never Used  Substance and Sexual Activity   Alcohol use:  Yes    Comment: social    Drug use: Yes    Types: Marijuana, Oxycodone     Comment: last smoked 09-19-21, oxy daily for chronic pain from pain clinic   Sexual activity: Not Currently  Other Topics Concern   Not on file  Social History Narrative   Not on file   Social Drivers of Health   Financial Resource Strain: Not on file  Food Insecurity: Not on file  Transportation Needs: Not on file  Physical Activity: Not on file  Stress: Not on file  Social Connections: Not on file     Family History:  The patient's family history includes Heart attack in his mother.   Review of Systems:    Please see the history of present illness.     All other systems reviewed and are otherwise negative except as noted above.   Physical Exam:    VS:  BP 98/62 (BP Location: Left Arm)   Pulse 92   Ht 5' 8 (1.727 m)   Wt 182 lb 6.4 oz (82.7 kg)   SpO2 100%   BMI 27.73 kg/m    General: Pleasant male appearing in no acute distress. Head: Normocephalic, atraumatic. Neck: No carotid bruits. JVD not elevated Lungs: CTA   Heart: Irregularly irregular. No S3 or S4.  No murmur,  Abdomen: Appears non-distended. No obvious abdominal masses.  Extremities: No LE edema   Wt Readings from Last 3 Encounters:  12/01/23 182 lb 6.4 oz (82.7 kg)  09/14/23 175 lb 0.7 oz (79.4 kg)  09/02/23 175 lb 0.7 oz (79.4 kg)     Studies/Labs Reviewed:   EKG:  EKG is not done today .   Recent Labs: 06/29/2023: ALT 19 09/02/2023: Magnesium 2.2 09/21/2023: BUN 13; Creatinine, Ser 1.01; Hemoglobin 12.2; Platelets 171; Potassium 4.2; Sodium 140   Lipid Panel No results found for: CHOL, TRIG, HDL, CHOLHDL, VLDL, LDLCALC, LDLDIRECT  Additional studies/ records that were reviewed today include:   Myoview  04/2021   Findings are equivocal. The study is low risk.   No ST deviation was noted. Atrial fibrillation present throughout with heart rate increasing to the 140s following Lexiscan . The ECG was  negative for ischemia.   LV perfusion is equivocal.  Small, mild intensity, partially reversible apical inferolateral defect with evidence of diaphragmatic attenuation as well.  Cannot exclude mild ischemic territory in this distribution   Left ventricular function is  normal. Nuclear stress EF: 69 %.   Low risk study with possible mild apical inferolateral ischemic territory in the setting of diaphragmatic attenuation, LVEF 69%.  Echocardiogram: 04/2021 IMPRESSIONS     1. Left ventricular ejection fraction, by estimation, is 65 to 70%. The  left ventricle has normal function. The left ventricle has no regional  wall motion abnormalities. Left ventricular diastolic parameters are  indeterminate.   2. Right ventricular systolic function is normal. The right ventricular  size is normal. Tricuspid regurgitation signal is inadequate for assessing  PA pressure.   3. Left atrial size was upper normal.   4. Right atrial size was upper normal.   5. The mitral valve is grossly normal. Mild mitral valve regurgitation.   6. The aortic valve is tricuspid. Aortic valve regurgitation is not  visualized.   7. The inferior vena cava is normal in size with greater than 50%  respiratory variability, suggesting right atrial pressure of 3 mmHg.   Comparison(s): No prior Echocardiogram.   Event Monitor: 04/2021 Patch Wear Time:  2 days and 0 hours (2022-10-24T14:46:45-0400 to 2022-10-26T15:06:59-0400)   Atrial Fibrillation occurred continuously (100% burden), ranging from 48-175 bpm (avg of 77 bpm). No Isolated VEs, VE Couplets, or VE Triplets were present.    Plan:   In order of problems listed above:  1. Permanent Atrial Fibrillation Pt doing well   No SOB   HR controlled Keep on  Eliquis       2. HTN -   BP is low normal   Pt taking hydrochlorothiazide  12.5 and 5 mg lisinopril    Follow   He denies dizziness  3  HL   Pt on 10 mg Crestor   LDL 38  HDL 38  Trig 82  Pt being evaluated for  possible surgery   From a surgical standpoint he is relatively low risk from a cardiac standpoint     Medication Adjustments/Labs and Tests Ordered: Current medicines are reviewed at length with the patient today.  Concerns regarding medicines are outlined above.  Medication changes, Labs and Tests ordered today are listed in the Patient Instructions below.     Signed, Vina Gull, MD  12/01/2023 8:54 PM    [facility] [address] [place], [place] [address] Phone: [phone] Fax: [phone]

## 2024-07-11 ENCOUNTER — Telehealth: Payer: Self-pay | Admitting: Student

## 2024-07-11 NOTE — Telephone Encounter (Signed)
 Copied from CRM #[id number]. Topic: Appointments - Scheduling Inquiry for Clinic >> Jul 11, 2024  9:51 AM Emylou G wrote: Reason for CRM: Patient was in the hosp for 3 days due to a slip/fall - can he be seen sooner.. he is new patient

## 2024-08-16 ENCOUNTER — Ambulatory Visit: Admitting: Internal Medicine

## 2024-12-04 ENCOUNTER — Ambulatory Visit: Payer: Self-pay
